# Patient Record
Sex: Female | Born: 1955 | Race: White | Hispanic: No | Marital: Married | Smoking: Never smoker
Health system: Southern US, Community
[De-identification: ages and names within clinical notes are randomized; demographics above are authoritative.]

## PROBLEM LIST (undated history)

## (undated) DIAGNOSIS — I10 Essential (primary) hypertension: Secondary | ICD-10-CM

## (undated) DIAGNOSIS — T7840XA Allergy, unspecified, initial encounter: Secondary | ICD-10-CM

## (undated) HISTORY — DX: Essential (primary) hypertension: I10

## (undated) HISTORY — DX: Allergy, unspecified, initial encounter: T78.40XA

## (undated) HISTORY — PX: OTHER SURGICAL HISTORY: SHX169

---

## 2019-01-24 ENCOUNTER — Other Ambulatory Visit: Payer: Self-pay

## 2019-01-24 ENCOUNTER — Ambulatory Visit (INDEPENDENT_AMBULATORY_CARE_PROVIDER_SITE_OTHER): Payer: Federal, State, Local not specified - PPO | Admitting: Osteopathic Medicine

## 2019-01-24 ENCOUNTER — Encounter: Payer: Self-pay | Admitting: Osteopathic Medicine

## 2019-01-24 VITALS — BP 161/91 | HR 83 | Temp 97.9°F | Ht 62.0 in | Wt 183.0 lb

## 2019-01-24 DIAGNOSIS — Z23 Encounter for immunization: Secondary | ICD-10-CM

## 2019-01-24 DIAGNOSIS — I10 Essential (primary) hypertension: Secondary | ICD-10-CM | POA: Diagnosis not present

## 2019-01-24 DIAGNOSIS — N6320 Unspecified lump in the left breast, unspecified quadrant: Secondary | ICD-10-CM | POA: Insufficient documentation

## 2019-01-24 MED ORDER — HYDROCHLOROTHIAZIDE 25 MG PO TABS: 25.0000 mg | ORAL_TABLET | Freq: Every day | ORAL | 0 refills | Status: DC

## 2019-01-24 MED ORDER — ESOMEPRAZOLE MAGNESIUM 40 MG PO CPDR: 40.0000 mg | DELAYED_RELEASE_CAPSULE | ORAL | 1 refills | Status: DC

## 2019-01-24 NOTE — Progress Notes (Signed)
HPI: Hannah Hull is a 63 y.o. female who  has no past medical history on file.  she presents to [facility] today, 01/24/19,  for chief complaint of: New to establish care  HTN  Doing well overall, she and her husband moved here in April to be closer to family. She is retired. Husband also retired.   HTN: BP "borderline" in the past, younger sister is also on BP meds. No CP/SOB, no HA/VC, no dizziness. No Hx cardiac problems.   Mammogram: needs follow-up on L breast, she had mammo/US in January for mass of some sort, no hx cancer.      Past medical, surgical, social and family history reviewed:  Patient Active Problem List   Diagnosis Date Noted  . Left breast mass 01/24/2019  . Essential hypertension 01/24/2019    Past Surgical History:  Procedure Laterality Date  . broken bone repair      Social History   Tobacco Use  . Smoking status: Never Smoker  . Smokeless tobacco: Never Used  Substance Use Topics  . Alcohol use: Never    Frequency: Never    Family History  Problem Relation Age of Onset  . Breast cancer Maternal Aunt      Current medication list and allergy/intolerance information reviewed:    Current Outpatient Medications  Medication Sig Dispense Refill  . esomeprazole (NEXIUM) 40 MG capsule Take 1 capsule (40 mg total) by mouth every 3 (three) days. 90 capsule 1  . hydrochlorothiazide (HYDRODIURIL) 25 MG tablet Take 1 tablet (25 mg total) by mouth daily. 90 tablet 0   No current facility-administered medications for this visit.     Not on File    Review of Systems:  Constitutional:  No  fever, no chills, No recent illness, No unintentional weight changes. No significant fatigue.   HEENT: No  headache, no vision change, no hearing change, No sore throat, No  sinus pressure  Cardiac: No  chest pain, No  pressure, No palpitations, No  Orthopnea  Respiratory:  No  shortness of breath. No   Cough  Gastrointestinal: No  abdominal pain, No  nausea, No  vomiting,  No  blood in stool, No  diarrhea, No  constipation   Musculoskeletal: No new myalgia/arthralgia  Skin: No  Rash, No other wounds/concerning lesions  Genitourinary: No  incontinence, No  abnormal genital bleeding, No abnormal genital discharge  Hem/Onc: No  easy bruising/bleeding  Endocrine: No cold intolerance,  No heat intolerance. No polyuria/polydipsia/polyphagia   Neurologic: No  weakness, No  dizziness  Psychiatric: No  concerns with depression, No  concerns with anxiety, No sleep problems, No mood problems  Exam:  BP (!) 161/91 (BP Location: Left Arm, Patient Position: Sitting, Cuff Size: Large)   Pulse 83   Temp 97.9 F (36.6 C) (Oral)   Ht 5\' 2"  (1.575 m)   Wt 183 lb (83 kg)   BMI 33.47 kg/m   Constitutional: VS see above. General Appearance: alert, well-developed, well-nourished, NAD  Eyes: Normal lids and conjunctive, non-icteric sclera  Neck: No masses, trachea midline. No thyroid enlargement. No tenderness/mass appreciated. No lymphadenopathy  Respiratory: Normal respiratory effort. no wheeze, no rhonchi, no rales  Cardiovascular: S1/S2 normal, no murmur, no rub/gallop auscultated. RRR. No lower extremity edema.   Gastrointestinal: Nontender, no masses. No hepatomegaly, no splenomegaly. No hernia appreciated. Bowel sounds normal. Rectal exam deferred.   Musculoskeletal: Gait normal. No clubbing/cyanosis of digits.   Neurological: Normal balance/coordination. No tremor.  Skin: warm, dry, intact. No rash/ulcer. No concerning nevi or subq nodules on limited exam.    Psychiatric: Normal judgment/insight. Normal mood and affect. Oriented x3.    No results found for this or any previous visit (from the past 72 hour(s)).  No results found.   ASSESSMENT/PLAN: The primary encounter diagnosis was Essential hypertension. Diagnoses of Need for influenza vaccination and Left breast mass were  also pertinent to this visit.   Orders Placed This Encounter  Procedures  . MM Digital Diagnostic Bilat  . US BREAST COMPLETE UNI LEFT INC AXILLA  . US BREAST COMPLETE UNI RIGHT INC AXILLA  . Flu Vaccine QUAD 6+ mos PF IM (Fluarix Quad PF)    Meds ordered this encounter  Medications  . esomeprazole (NEXIUM) 40 MG capsule    Sig: Take 1 capsule (40 mg total) by mouth every 3 (three) days.    Dispense:  90 capsule    Refill:  1  . hydrochlorothiazide (HYDRODIURIL) 25 MG tablet    Sig: Take 1 tablet (25 mg total) by mouth daily.    Dispense:  90 tablet    Refill:  0        Visit summary with medication list and pertinent instructions was printed for patient to review. All questions at time of visit were answered - patient instructed to contact office with any additional concerns or updates. ER/RTC precautions were reviewed with the patient.      Please note: voice recognition software was used to produce this document, and typos may escape review. Please contact Dr. Lyn Hollingshead for any needed clarifications.     Follow-up plan: Return in about 1 week (around 01/31/2019) for nurse visit BP recheck - further follow-up based on BP numbers. Will need labs probably 4-6 weeks. Marland Kitchen

## 2019-01-31 ENCOUNTER — Other Ambulatory Visit: Payer: Self-pay

## 2019-01-31 ENCOUNTER — Ambulatory Visit (INDEPENDENT_AMBULATORY_CARE_PROVIDER_SITE_OTHER): Payer: Federal, State, Local not specified - PPO | Admitting: Osteopathic Medicine

## 2019-01-31 VITALS — BP 137/64 | HR 80 | Wt 181.0 lb

## 2019-01-31 DIAGNOSIS — I10 Essential (primary) hypertension: Secondary | ICD-10-CM | POA: Diagnosis not present

## 2019-01-31 NOTE — Progress Notes (Signed)
Established Patient Office Visit  Subjective:  Patient ID: Hannah Hull, female    DOB: 1955-05-14  Age: 63 y.o. MRN: [id number]  CC:  Chief Complaint  Patient presents with  . Hypertension    HPI Hannah Hull presents for blood pressure check. Denies chest pain, shortness of breath or dizziness. She reports no medication problems. She did bring in her home blood pressure cuff. Readings from our office and the home monitor were about the same.   Home readings 137/82 134/82 143/78 130/88 138/83  History reviewed. No pertinent past medical history.  Past Surgical History:  Procedure Laterality Date  . broken bone repair      Family History  Problem Relation Age of Onset  . Breast cancer Maternal Aunt     Social History   Socioeconomic History  . Marital status: Married    Spouse name: Not on file  . Number of children: Not on file  . Years of education: Not on file  . Highest education level: Not on file  Occupational History  . Not on file  Social Needs  . Financial resource strain: Not on file  . Food insecurity    Worry: Not on file    Inability: Not on file  . Transportation needs    Medical: Not on file    Non-medical: Not on file  Tobacco Use  . Smoking status: Never Smoker  . Smokeless tobacco: Never Used  Substance and Sexual Activity  . Alcohol use: Never    Frequency: Never  . Drug use: Never  . Sexual activity: Not Currently    Partners: Male    Birth control/protection: Post-menopausal  Lifestyle  . Physical activity    Days per week: Not on file    Minutes per session: Not on file  . Stress: Not on file  Relationships  . Social Herbalist on phone: Not on file    Gets together: Not on file    Attends religious service: Not on file    Active member of club or organization: Not on file    Attends meetings of clubs or organizations: Not on file    Relationship status: Not on file  . Intimate partner violence    Fear  of current or ex partner: Not on file    Emotionally abused: Not on file    Physically abused: Not on file    Forced sexual activity: Not on file  Other Topics Concern  . Not on file  Social History Narrative  . Not on file    Outpatient Medications Prior to Visit  Medication Sig Dispense Refill  . esomeprazole (NEXIUM) 40 MG capsule Take 1 capsule (40 mg total) by mouth every 3 (three) days. 90 capsule 1  . hydrochlorothiazide (HYDRODIURIL) 25 MG tablet Take 1 tablet (25 mg total) by mouth daily. 90 tablet 0   No facility-administered medications prior to visit.     Not on File  ROS Review of Systems    Objective:    Physical Exam  BP 137/64   Pulse 80   Wt 181 lb (82.1 kg)   SpO2 (!) 80%   BMI 33.11 kg/m  Wt Readings from Last 3 Encounters:  01/31/19 181 lb (82.1 kg)  01/24/19 183 lb (83 kg)     Health Maintenance Due  Topic Date Due  . Hepatitis C Screening  Jul 11, 1955  . HIV Screening  02/21/1971  . PAP SMEAR-Modifier  02/20/1977  . MAMMOGRAM  02/20/2006  .  COLONOSCOPY  02/20/2006    There are no preventive care reminders to display for this patient.  No results found for: TSH No results found for: WBC, HGB, HCT, MCV, PLT No results found for: NA, K, CHLORIDE, CO2, GLUCOSE, BUN, CREATININE, BILITOT, ALKPHOS, AST, ALT, PROT, ALBUMIN, CALCIUM, ANIONGAP, EGFR, GFR No results found for: CHOL No results found for: HDL No results found for: LDLCALC No results found for: TRIG No results found for: CHOLHDL No results found for: HGBA1C    Assessment & Plan:  HTN - Patient advised to continue current medications as directed. Follow up in 3 months with Dr Sheppard Coil for HTN. Advised to call if readings are over 140/90 or if she has any medication problems.     Problem List Items Addressed This Visit    Essential hypertension - Primary      No orders of the defined types were placed in this encounter.   Follow-up: Return in about 3 months (around  05/03/2019) for with Dr Sheppard Coil for HTN. Durene Romans, Monico Blitz, [place]

## 2019-01-31 NOTE — Patient Instructions (Signed)
Continue current medications as directed. Follow up in 3 months with Dr Sheppard Coil for HTN.

## 2019-02-07 ENCOUNTER — Ambulatory Visit: Payer: Federal, State, Local not specified - PPO

## 2019-02-07 ENCOUNTER — Ambulatory Visit
Admission: RE | Admit: 2019-02-07 | Discharge: 2019-02-07 | Disposition: A | Discharge status: Home / Self Care | Payer: Federal, State, Local not specified - PPO | Source: Ambulatory Visit | Attending: Osteopathic Medicine | Admitting: Osteopathic Medicine

## 2019-02-07 ENCOUNTER — Other Ambulatory Visit: Payer: Self-pay | Admitting: Osteopathic Medicine

## 2019-02-07 ENCOUNTER — Other Ambulatory Visit: Payer: Self-pay

## 2019-02-07 DIAGNOSIS — N6320 Unspecified lump in the left breast, unspecified quadrant: Secondary | ICD-10-CM

## 2019-04-16 ENCOUNTER — Other Ambulatory Visit: Payer: Self-pay | Admitting: Osteopathic Medicine

## 2019-05-03 ENCOUNTER — Encounter: Payer: Self-pay | Admitting: Osteopathic Medicine

## 2019-05-03 ENCOUNTER — Ambulatory Visit (INDEPENDENT_AMBULATORY_CARE_PROVIDER_SITE_OTHER): Payer: Federal, State, Local not specified - PPO | Admitting: Osteopathic Medicine

## 2019-05-03 ENCOUNTER — Other Ambulatory Visit: Payer: Self-pay

## 2019-05-03 VITALS — BP 120/83 | HR 89 | Temp 98.7°F | Wt 189.0 lb

## 2019-05-03 DIAGNOSIS — Z0000 Encounter for general adult medical examination without abnormal findings: Secondary | ICD-10-CM

## 2019-05-03 DIAGNOSIS — I10 Essential (primary) hypertension: Secondary | ICD-10-CM | POA: Diagnosis not present

## 2019-05-03 DIAGNOSIS — Z1211 Encounter for screening for malignant neoplasm of colon: Secondary | ICD-10-CM

## 2019-05-03 DIAGNOSIS — K635 Polyp of colon: Secondary | ICD-10-CM

## 2019-05-03 NOTE — Patient Instructions (Addendum)
General Preventive Care  Most recent routine screening lipids/other labs: ordered today   Tobacco: don't!   Alcohol: responsible moderation is ok for most adults - if you have concerns about your alcohol intake, please talk to me!   Exercise: as tolerated to reduce risk of cardiovascular disease and diabetes. Strength training will also prevent osteoporosis.   Mental health: if need for mental health care (medicines, counseling, other), or concerns about moods, please let me know!   Sexual health: if need for STD testing, or if concerns with libido/pain problems, please let me know!  Advanced Directive: Living Will and/or Healthcare Power of Attorney recommended for all adults, regardless of age or health.  Vaccines  Flu vaccine: recommended for almost everyone, every fall.   Shingles vaccine: Shingrix recommended after age 22  Pneumonia vaccines: Prevnar and Pneumovax recommended after age 76, or sooner if certain medical conditions.  Tetanus booster: Tdap recommended every 10 years.  Cancer screenings   Colon cancer screening: referred to GI  Breast cancer screening: mammogram recommended annually after age 77.   Cervical cancer screening: Pap every 1 to 5 years depending on age and other risk factors. Can usually stop at age 93 or w/ hysterectomy.   Lung cancer screening: not needed for non-smokers  Infection screenings . HIV: recommended screening at least once age 69-65, more often as needed.  . Gonorrhea/Chlamydia: screening as needed . Hepatitis C: recommended for anyone born [birth year]-1965 . TB: certain at-risk populations, or depending on work requirements and/or travel history Other . Bone Density Test: recommended for women at age 62

## 2019-05-03 NOTE — Progress Notes (Signed)
HPI: Hannah Hull is a 64 y.o. female who  has no past medical history on file.  she presents to [facility] today, 05/03/19,  for chief complaint of: Physical BP recheck     Past medical, surgical, social and family history reviewed:  Patient Active Problem List   Diagnosis Date Noted  . Left breast mass 01/24/2019  . Essential hypertension 01/24/2019    Past Surgical History:  Procedure Laterality Date  . broken bone repair      Social History   Tobacco Use  . Smoking status: Never Smoker  . Smokeless tobacco: Never Used  Substance Use Topics  . Alcohol use: Never    Family History  Problem Relation Age of Onset  . Breast cancer Maternal Aunt      Current medication list and allergy/intolerance information reviewed:    Current Outpatient Medications  Medication Sig Dispense Refill  . esomeprazole (NEXIUM) 40 MG capsule Take 1 capsule (40 mg total) by mouth every 3 (three) days. 90 capsule 1  . hydrochlorothiazide (HYDRODIURIL) 25 MG tablet TAKE 1 TABLET BY MOUTH EVERY DAY 90 tablet 0   No current facility-administered medications for this visit.    Allergies  Allergen Reactions  . Other Anaphylaxis, Hives and Swelling    Tree nut allergy      Review of Systems:  Constitutional:  No  fever, no chills, No recent illness, No unintentional weight changes. No significant fatigue.   HEENT: No  headache, no vision change  Cardiac: No  chest pain, No  pressure, No palpitations, No  Orthopnea  Respiratory:  No  shortness of breath. No  Cough  Gastrointestinal: No  abdominal pain, No  nausea  Hem/Onc: No  easy bruising/bleeding, No  abnormal lymph node  Endocrine: No cold intolerance,  No heat intolerance.  Neurologic: No  weakness, No  dizziness,  Psychiatric: No  concerns with depression, No  concerns with anxiety,   Exam:  BP 120/83 (BP Location: Left Arm, Patient Position: Sitting, Cuff Size: Large)   Pulse  89   Temp 98.7 F (37.1 C) (Oral)   Wt 189 lb 0.6 oz (85.7 kg)   BMI 34.58 kg/m   Constitutional: VS see above. General Appearance: alert, well-developed, well-nourished, NAD  Eyes: Normal lids and conjunctive, non-icteric sclera  Ears, Nose, Mouth, Throat: MMM, Normal external inspection ears/nares/mouth/lips/gums. TM normal bilaterally. Pharynx/tonsils no erythema, no exudate. Nasal mucosa normal.   Neck: No masses, trachea midline. No thyroid enlargement. No tenderness/mass appreciated. No lymphadenopathy  Respiratory: Normal respiratory effort. no wheeze, no rhonchi, no rales  Cardiovascular: S1/S2 normal, no murmur, no rub/gallop auscultated. RRR. No lower extremity edema. Pedal pulse II/IV bilaterally DP and PT. No carotid bruit or JVD. No abdominal aortic bruit.  Gastrointestinal: Nontender, no masses. No hepatomegaly, no splenomegaly. No hernia appreciated. Bowel sounds normal. Rectal exam deferred.   Musculoskeletal: Gait normal. No clubbing/cyanosis of digits.   Neurological: Normal balance/coordination. No tremor. No cranial nerve deficit on limited exam. Motor and sensation intact and symmetric. Cerebellar reflexes intact.   Skin: warm, dry, intact. No rash/ulcer. No concerning nevi or subq nodules on limited exam.    Psychiatric: Normal judgment/insight. Normal mood and affect. Oriented x3.    No results found for this or any previous visit (from the past 72 hour(s)).  No results found.   ASSESSMENT/PLAN: The primary encounter diagnosis was Annual physical exam. Diagnoses of Essential hypertension, Polyp of colon, unspecified part of colon, unspecified type, and Colon  cancer screening were also pertinent to this visit.   Orders Placed This Encounter  Procedures  . COMPLETE METABOLIC PANEL WITH GFR  . Lipid panel  . CBC  . Ambulatory referral to Gastroenterology    No orders of the defined types were placed in this encounter.   Patient Instructions   General Preventive Care  Most recent routine screening lipids/other labs: ordered today   Tobacco: don't!   Alcohol: responsible moderation is ok for most adults - if you have concerns about your alcohol intake, please talk to me!   Exercise: as tolerated to reduce risk of cardiovascular disease and diabetes. Strength training will also prevent osteoporosis.   Mental health: if need for mental health care (medicines, counseling, other), or concerns about moods, please let me know!   Sexual health: if need for STD testing, or if concerns with libido/pain problems, please let me know!  Advanced Directive: Living Will and/or Healthcare Power of Attorney recommended for all adults, regardless of age or health.  Vaccines  Flu vaccine: recommended for almost everyone, every fall.   Shingles vaccine: Shingrix recommended after age 45  Pneumonia vaccines: Prevnar and Pneumovax recommended after age 65, or sooner if certain medical conditions.  Tetanus booster: Tdap recommended every 10 years.  Cancer screenings   Colon cancer screening: referred to GI  Breast cancer screening: mammogram recommended annually after age 18.   Cervical cancer screening: Pap every 1 to 5 years depending on age and other risk factors. Can usually stop at age 103 or w/ hysterectomy.   Lung cancer screening: not needed for non-smokers  Infection screenings . HIV: recommended screening at least once age 76-65, more often as needed.  . Gonorrhea/Chlamydia: screening as needed . Hepatitis C: recommended for anyone born [birth year]-1965 . TB: certain at-risk populations, or depending on work requirements and/or travel history Other . Bone Density Test: recommended for women at age 25            Visit summary with medication list and pertinent instructions was printed for patient to review. All questions at time of visit were answered - patient instructed to contact office with any additional concerns or  updates. ER/RTC precautions were reviewed with the patient.   are was used to produce this document, and typos may escape review. Please contact Dr. Sheppard Coil for any needed clarifications.     Follow-up plan: Return for lab visit only, fasting, about 6 weeks or so. If that's good, see me in 6 mos follow up BP! Marland Kitchen

## 2019-06-12 ENCOUNTER — Encounter: Payer: Self-pay | Admitting: Medical-Surgical

## 2019-06-20 LAB — CBC
HCT: 38.3 % (ref 35.0–45.0)
Hemoglobin: 12.4 g/dL (ref 11.7–15.5)
MCH: 26.6 pg — ABNORMAL LOW (ref 27.0–33.0)
MCHC: 32.4 g/dL (ref 32.0–36.0)
MCV: 82.2 fL (ref 80.0–100.0)
MPV: 11.8 fL (ref 7.5–12.5)
Platelets: 262 10*3/uL (ref 140–400)
RBC: 4.66 10*6/uL (ref 3.80–5.10)
RDW: 13.1 % (ref 11.0–15.0)
WBC: 7.5 10*3/uL (ref 3.8–10.8)

## 2019-06-20 LAB — COMPLETE METABOLIC PANEL WITH GFR
AG Ratio: 1.4 (calc) (ref 1.0–2.5)
ALT: 17 U/L (ref 6–29)
AST: 18 U/L (ref 10–35)
Albumin: 4.1 g/dL (ref 3.6–5.1)
Alkaline phosphatase (APISO): 81 U/L (ref 37–153)
BUN: 19 mg/dL (ref 7–25)
CO2: 28 mmol/L (ref 20–32)
Calcium: 9.4 mg/dL (ref 8.6–10.4)
Chloride: 102 mmol/L (ref 98–110)
Creat: 0.8 mg/dL (ref 0.50–0.99)
GFR, Est African American: 91 mL/min/{1.73_m2} (ref >=60)
GFR, Est Non African American: 78 mL/min/{1.73_m2} (ref >=60)
Globulin: 2.9 g/dL (calc) (ref 1.9–3.7)
Glucose, Bld: 92 mg/dL (ref 65–99)
Potassium: 4.1 mmol/L (ref 3.5–5.3)
Sodium: 139 mmol/L (ref 135–146)
Total Bilirubin: 0.5 mg/dL (ref 0.2–1.2)
Total Protein: 7 g/dL (ref 6.1–8.1)

## 2019-06-20 LAB — LIPID PANEL
Cholesterol: 191 mg/dL (ref <200)
HDL: 73 mg/dL (ref >=50)
LDL Cholesterol (Calc): 99 mg/dL (calc)
Non-HDL Cholesterol (Calc): 118 mg/dL (calc) (ref <130)
Total CHOL/HDL Ratio: 2.6 (calc) (ref <5.0)
Triglycerides: 99 mg/dL (ref <150)

## 2019-07-10 ENCOUNTER — Other Ambulatory Visit: Payer: Self-pay | Admitting: Osteopathic Medicine

## 2019-08-29 ENCOUNTER — Encounter: Payer: Self-pay | Admitting: Osteopathic Medicine

## 2019-09-01 ENCOUNTER — Other Ambulatory Visit: Payer: Self-pay | Admitting: Osteopathic Medicine

## 2019-09-01 DIAGNOSIS — N6489 Other specified disorders of breast: Secondary | ICD-10-CM

## 2019-09-22 ENCOUNTER — Other Ambulatory Visit: Payer: Self-pay

## 2019-09-22 ENCOUNTER — Ambulatory Visit
Admission: RE | Admit: 2019-09-22 | Discharge: 2019-09-22 | Disposition: A | Discharge status: Home / Self Care | Payer: Federal, State, Local not specified - PPO | Source: Ambulatory Visit | Attending: Osteopathic Medicine | Admitting: Osteopathic Medicine

## 2019-09-22 DIAGNOSIS — N6489 Other specified disorders of breast: Secondary | ICD-10-CM

## 2019-10-24 ENCOUNTER — Telehealth: Payer: Self-pay

## 2019-10-24 NOTE — Telephone Encounter (Signed)
Hannah Hull states she is now taking the Nexium every two days and would like the prescription changed. Please advise.

## 2019-10-26 MED ORDER — ESOMEPRAZOLE MAGNESIUM 40 MG PO CPDR: 40.0000 mg | DELAYED_RELEASE_CAPSULE | ORAL | 1 refills | Status: DC

## 2019-11-22 ENCOUNTER — Ambulatory Visit (INDEPENDENT_AMBULATORY_CARE_PROVIDER_SITE_OTHER): Payer: Federal, State, Local not specified - PPO | Admitting: Osteopathic Medicine

## 2019-11-22 ENCOUNTER — Encounter: Payer: Self-pay | Admitting: Osteopathic Medicine

## 2019-11-22 VITALS — BP 129/79 | HR 75 | Wt 187.0 lb

## 2019-11-22 DIAGNOSIS — I10 Essential (primary) hypertension: Secondary | ICD-10-CM | POA: Diagnosis not present

## 2019-11-22 MED ORDER — HYDROCHLOROTHIAZIDE 25 MG PO TABS: 25.0000 mg | ORAL_TABLET | Freq: Every day | ORAL | 3 refills | Status: DC

## 2019-11-22 NOTE — Progress Notes (Signed)
Hannah Hull is a 64 y.o. female who presents to  [facility]  today, 11/22/19, seeking care for the following:  . BP check  BP Readings from Last 3 Encounters:  11/22/19 129/79  05/03/19 120/83  01/31/19 137/64      ASSESSMENT & PLAN with other pertinent findings:  The encounter diagnosis was Essential hypertension.   No results found for this or any previous visit (from the past 24 hour(s)).  There are no Patient Instructions on file for this visit.  No orders of the defined types were placed in this encounter.   Meds ordered this encounter  Medications  . hydrochlorothiazide (HYDRODIURIL) 25 MG tablet    Sig: Take 1 tablet (25 mg total) by mouth daily.    Dispense:  90 tablet    Refill:  3       Follow-up instructions: Return in about 6 months (around 05/24/2020) for ANNUAL (call week prior to visit for lab orders).                                         BP 129/79 (BP Location: Left Arm, Patient Position: Sitting)   Pulse 75   Wt 187 lb (84.8 kg)   SpO2 96%   BMI 34.20 kg/m   Current Meds  Medication Sig  . esomeprazole (NEXIUM) 40 MG capsule Take 1 capsule (40 mg total) by mouth every other day.  . hydrochlorothiazide (HYDRODIURIL) 25 MG tablet Take 1 tablet (25 mg total) by mouth daily.  . [DISCONTINUED] hydrochlorothiazide (HYDRODIURIL) 25 MG tablet TAKE 1 TABLET BY MOUTH EVERY DAY    No results found for this or any previous visit (from the past 72 hour(s)).  No results found.     All questions at time of visit were answered - patient instructed to contact office with any additional concerns or updates.  ER/RTC precautions were reviewed with the patient as applicable.   Please note: voice recognition software was used to produce this document, and typos may escape review. Please contact Dr. Lyn Hollingshead for any needed clarifications.

## 2020-05-29 ENCOUNTER — Ambulatory Visit (INDEPENDENT_AMBULATORY_CARE_PROVIDER_SITE_OTHER): Payer: Federal, State, Local not specified - PPO | Admitting: Osteopathic Medicine

## 2020-05-29 ENCOUNTER — Encounter: Payer: Self-pay | Admitting: Osteopathic Medicine

## 2020-05-29 ENCOUNTER — Other Ambulatory Visit: Payer: Self-pay

## 2020-05-29 VITALS — BP 135/83 | HR 73 | Temp 98.1°F | Wt 185.1 lb

## 2020-05-29 DIAGNOSIS — I10 Essential (primary) hypertension: Secondary | ICD-10-CM | POA: Diagnosis not present

## 2020-05-29 DIAGNOSIS — Z0000 Encounter for general adult medical examination without abnormal findings: Secondary | ICD-10-CM | POA: Diagnosis not present

## 2020-05-29 DIAGNOSIS — Z1231 Encounter for screening mammogram for malignant neoplasm of breast: Secondary | ICD-10-CM

## 2020-05-29 DIAGNOSIS — K635 Polyp of colon: Secondary | ICD-10-CM

## 2020-05-29 DIAGNOSIS — Z1211 Encounter for screening for malignant neoplasm of colon: Secondary | ICD-10-CM | POA: Diagnosis not present

## 2020-05-29 MED ORDER — ESOMEPRAZOLE MAGNESIUM 40 MG PO CPDR: 40.0000 mg | DELAYED_RELEASE_CAPSULE | ORAL | 1 refills | Status: DC

## 2020-05-29 MED ORDER — HYDROCHLOROTHIAZIDE 25 MG PO TABS: 25.0000 mg | ORAL_TABLET | Freq: Every day | ORAL | 3 refills | Status: DC

## 2020-05-29 NOTE — Patient Instructions (Addendum)
General Preventive Care  Most recent routine screening labs: ordered.   Blood pressure goal 130/80 or less.   Tobacco: don't!   Alcohol: responsible moderation is ok for most adults - if you have concerns about your alcohol intake, please talk to me!   Exercise: as tolerated to reduce risk of cardiovascular disease and diabetes. Strength training will also prevent osteoporosis.   Mental health: if need for mental health care (medicines, counseling, other), or concerns about moods, please let me know!   Sexual / Reproductive health: if need for STD testing, or if concerns with libido/pain problems, please let me know!  Advanced Directive: Living Will and/or Healthcare Power of Attorney recommended for all adults, regardless of age or health.  Vaccines   Flu vaccine: every fall/winter  Shingles vaccine: at age 70.   Pneumonia vaccines: at age 76  Tetanus booster: every 10 years   COVID vaccine: THANKS for getting your vaccine! :) Cancer screenings   Colon cancer screening: colonoscopy when due (we never got records!)  Breast cancer screening: mammogram due 08/2020 - ordered!   Cervical cancer screening:   Lung cancer screening: not needed for non-smokers  Infection screenings  . HIV: recommended screening at least once age 45-65, more often as needed. . Gonorrhea/Chlamydia: screening as needed. . Hepatitis C: recommended once for everyone age 42-75 . TB: certain at-risk populations, or depending on work requirements and/or travel history Other . Bone Density Test: recommended at age 24

## 2020-05-29 NOTE — Progress Notes (Signed)
Hannah Hull is a 65 y.o. female who presents to  [facility] at [facility]  today, 05/29/20, seeking care for the following:  . Annual physical      ASSESSMENT & PLAN with other pertinent findings:  The primary encounter diagnosis was Annual physical exam. Diagnoses of Essential hypertension, Polyp of colon, unspecified part of colon, unspecified type, Colon cancer screening, and Breast cancer screening by mammogram were also pertinent to this visit.   Needs records: colonoscopy, pap, vaccines Pt declines all vaccinations  Pt declines GI referral  Plan for Pap next year  Ordered mammo for 08/2020   Patient Instructions  General Preventive Care  Most recent routine screening labs: ordered.   Blood pressure goal 130/80 or less.   Tobacco: don't!   Alcohol: responsible moderation is ok for most adults - if you have concerns about your alcohol intake, please talk to me!   Exercise: as tolerated to reduce risk of cardiovascular disease and diabetes. Strength training will also prevent osteoporosis.   Mental health: if need for mental health care (medicines, counseling, other), or concerns about moods, please let me know!   Sexual / Reproductive health: if need for STD testing, or if concerns with libido/pain problems, please let me know!  Advanced Directive: Living Will and/or Healthcare Power of Attorney recommended for all adults, regardless of age or health.  Vaccines   Flu vaccine: every fall/winter  Shingles vaccine: at age 33.   Pneumonia vaccines: at age 34  Tetanus booster: every 10 years   COVID vaccine: THANKS for getting your vaccine! :) Cancer screenings   Colon cancer screening: colonoscopy when due (we never got records!)  Breast cancer screening: mammogram due 08/2020 - ordered!   Cervical cancer screening:   Lung cancer screening: not needed for non-smokers  Infection screenings  . HIV: recommended  screening at least once age 26-65, more often as needed. . Gonorrhea/Chlamydia: screening as needed. . Hepatitis C: recommended once for everyone age 51-75 . TB: certain at-risk populations, or depending on work requirements and/or travel history Other . Bone Density Test: recommended at age 68   Orders Placed This Encounter  Procedures  . MM 3D SCREEN BREAST BILATERAL  . CBC  . COMPLETE METABOLIC PANEL WITH GFR  . Lipid panel    Meds ordered this encounter  Medications  . esomeprazole (NEXIUM) 40 MG capsule    Sig: Take 1 capsule (40 mg total) by mouth every other day.    Dispense:  90 capsule    Refill:  1  . hydrochlorothiazide (HYDRODIURIL) 25 MG tablet    Sig: Take 1 tablet (25 mg total) by mouth daily.    Dispense:  90 tablet    Refill:  3             See below for relevant physical exam findings  See below for recent lab and imaging results reviewed  Medications, allergies, PMH, PSH, SocH, FamH reviewed below    Follow-up instructions: Return in about 1 year (around 05/29/2021) for [employer] - SEE [place] SOONER IF NEEDED.                                        Exam:  BP 135/83 (BP Location: Left Arm, Patient Position: Sitting, Cuff Size: Large)   Pulse 73   Temp 98.1 F (36.7 C) (Oral)   Wt  185 lb 1.3 oz (84 kg)   BMI 33.85 kg/m   Constitutional: VS see above. General Appearance: alert, well-developed, well-nourished, NAD  Neck: No masses, trachea midline.   Respiratory: Normal respiratory effort. no wheeze, no rhonchi, no rales  Cardiovascular: S1/S2 normal, no murmur, no rub/gallop auscultated. RRR.   Musculoskeletal: Gait normal. Symmetric and independent movement of all extremities  Abdominal: non-tender, non-distended, no appreciable organomegaly, neg Murphy's, BS WNLx4  Neurological: Normal balance/coordination. No tremor.  Skin: warm, dry, intact.   Psychiatric: Normal judgment/insight. Normal  mood and affect. Oriented x3.   Current Meds  Medication Sig  . [DISCONTINUED] esomeprazole (NEXIUM) 40 MG capsule Take 1 capsule (40 mg total) by mouth every other day.  . [DISCONTINUED] hydrochlorothiazide (HYDRODIURIL) 25 MG tablet Take 1 tablet (25 mg total) by mouth daily.    Allergies  Allergen Reactions  . Other Anaphylaxis, Hives and Swelling    Tree nut allergy    Patient Active Problem List   Diagnosis Date Noted  . Left breast mass 01/24/2019  . Essential hypertension 01/24/2019    Family History  Problem Relation Age of Onset  . Breast cancer Maternal Aunt     Social History   Tobacco Use  Smoking Status Never Smoker  Smokeless Tobacco Never Used    Past Surgical History:  Procedure Laterality Date  . broken bone repair      Immunization History  Administered Date(s) Administered  . Influenza,inj,Quad PF,6+ Mos 01/24/2019  . Moderna Sars-Covid-2 Vaccination 06/26/2019, 07/23/2019    No results found for this or any previous visit (from the past 2160 hour(s)).  No results found.     All questions at time of visit were answered - patient instructed to contact office with any additional concerns or updates. ER/RTC precautions were reviewed with the patient as applicable.   Please note: manual typing as well as voice recognition software may have been used to produce this document - typos may escape review. Please contact Dr. Lyn Hollingshead for any needed clarifications.

## 2020-06-18 LAB — CBC
HCT: 40.7 % (ref 35.0–45.0)
Hemoglobin: 13.3 g/dL (ref 11.7–15.5)
MCH: 26.3 pg — ABNORMAL LOW (ref 27.0–33.0)
MCHC: 32.7 g/dL (ref 32.0–36.0)
MCV: 80.4 fL (ref 80.0–100.0)
MPV: 11.4 fL (ref 7.5–12.5)
Platelets: 284 10*3/uL (ref 140–400)
RBC: 5.06 10*6/uL (ref 3.80–5.10)
RDW: 12.6 % (ref 11.0–15.0)
WBC: 7.5 10*3/uL (ref 3.8–10.8)

## 2020-06-18 LAB — COMPLETE METABOLIC PANEL WITH GFR
AG Ratio: 1.3 (calc) (ref 1.0–2.5)
ALT: 13 U/L (ref 6–29)
AST: 16 U/L (ref 10–35)
Albumin: 4.1 g/dL (ref 3.6–5.1)
Alkaline phosphatase (APISO): 91 U/L (ref 37–153)
BUN: 17 mg/dL (ref 7–25)
CO2: 29 mmol/L (ref 20–32)
Calcium: 9.3 mg/dL (ref 8.6–10.4)
Chloride: 99 mmol/L (ref 98–110)
Creat: 0.86 mg/dL (ref 0.50–0.99)
GFR, Est African American: 83 mL/min/{1.73_m2} (ref >=60)
GFR, Est Non African American: 71 mL/min/{1.73_m2} (ref >=60)
Globulin: 3.2 g/dL (calc) (ref 1.9–3.7)
Glucose, Bld: 88 mg/dL (ref 65–99)
Potassium: 3.8 mmol/L (ref 3.5–5.3)
Sodium: 138 mmol/L (ref 135–146)
Total Bilirubin: 0.8 mg/dL (ref 0.2–1.2)
Total Protein: 7.3 g/dL (ref 6.1–8.1)

## 2020-06-18 LAB — LIPID PANEL
Cholesterol: 208 mg/dL — ABNORMAL HIGH (ref <200)
HDL: 75 mg/dL (ref >=50)
LDL Cholesterol (Calc): 113 mg/dL (calc) — ABNORMAL HIGH
Non-HDL Cholesterol (Calc): 133 mg/dL (calc) — ABNORMAL HIGH (ref <130)
Total CHOL/HDL Ratio: 2.8 (calc) (ref <5.0)
Triglycerides: 98 mg/dL (ref <150)

## 2020-07-18 IMAGING — MG DIGITAL DIAGNOSTIC BILAT W/ TOMO W/ CAD
6 of 12 series · 6 of 36 positions shown · non-contrast
Comparison: Previous exam(s).

CLINICAL DATA: Short-term follow-up for probably benign asymmetry
in the inner posterior left breast.

EXAM:
DIGITAL DIAGNOSTIC BILATERAL MAMMOGRAM WITH CAD AND TOMO

[R CC synth-2D (1 of 2)]
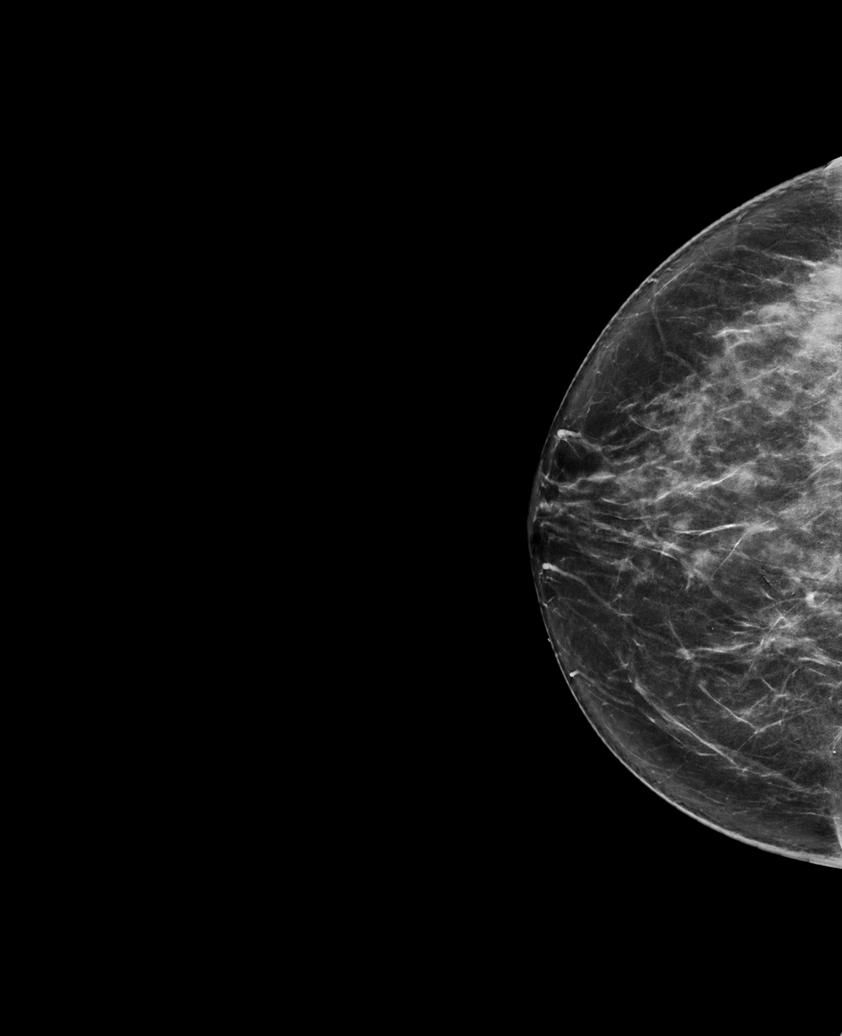

[L MLO synth-2D]
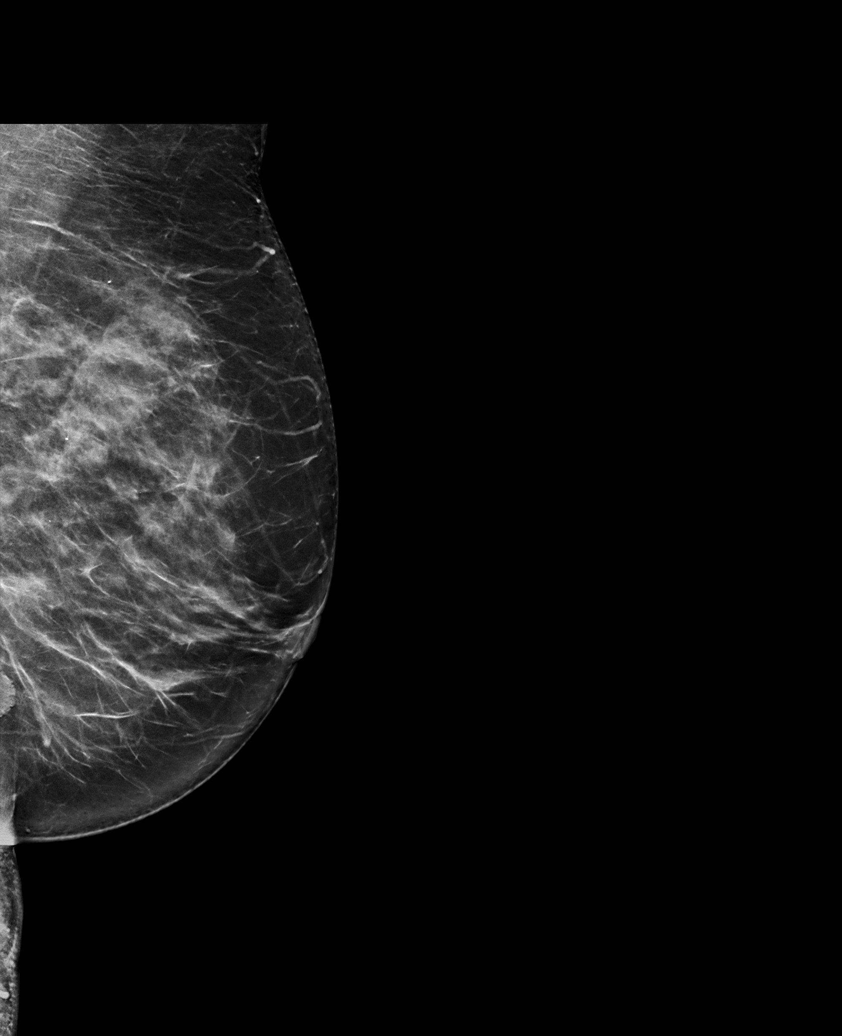

[R CC synth-2D (2 of 2)]
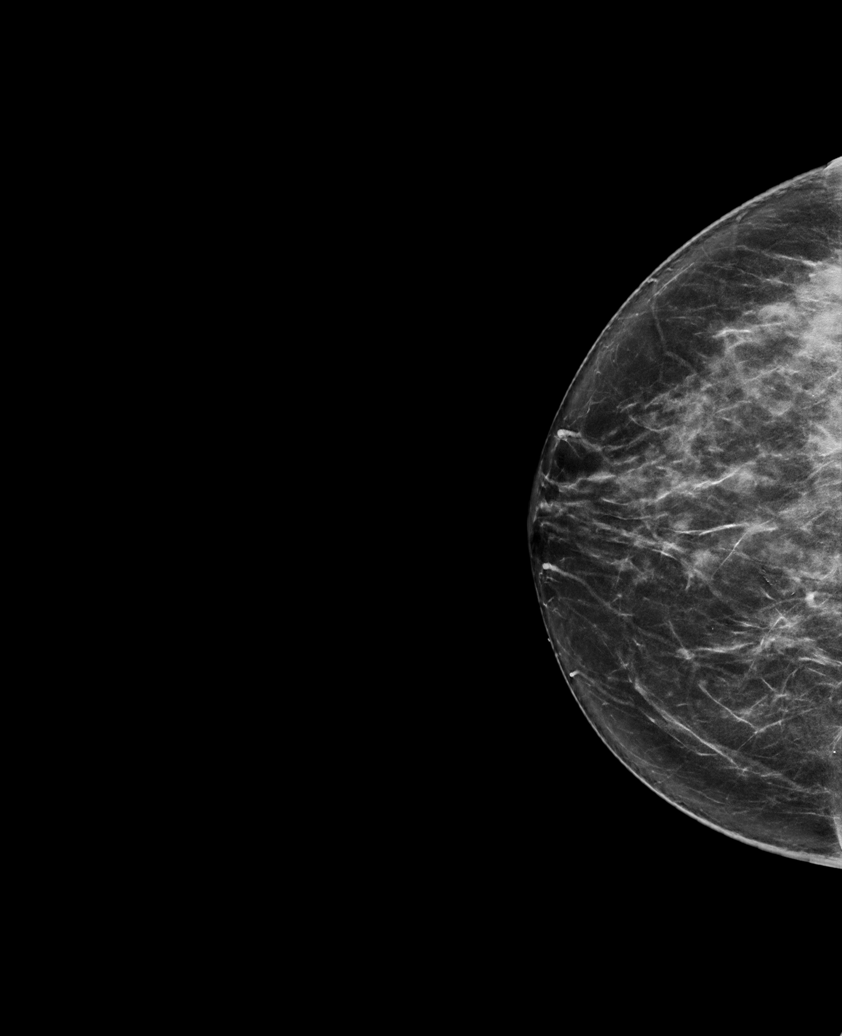

[L CC synth-2D]
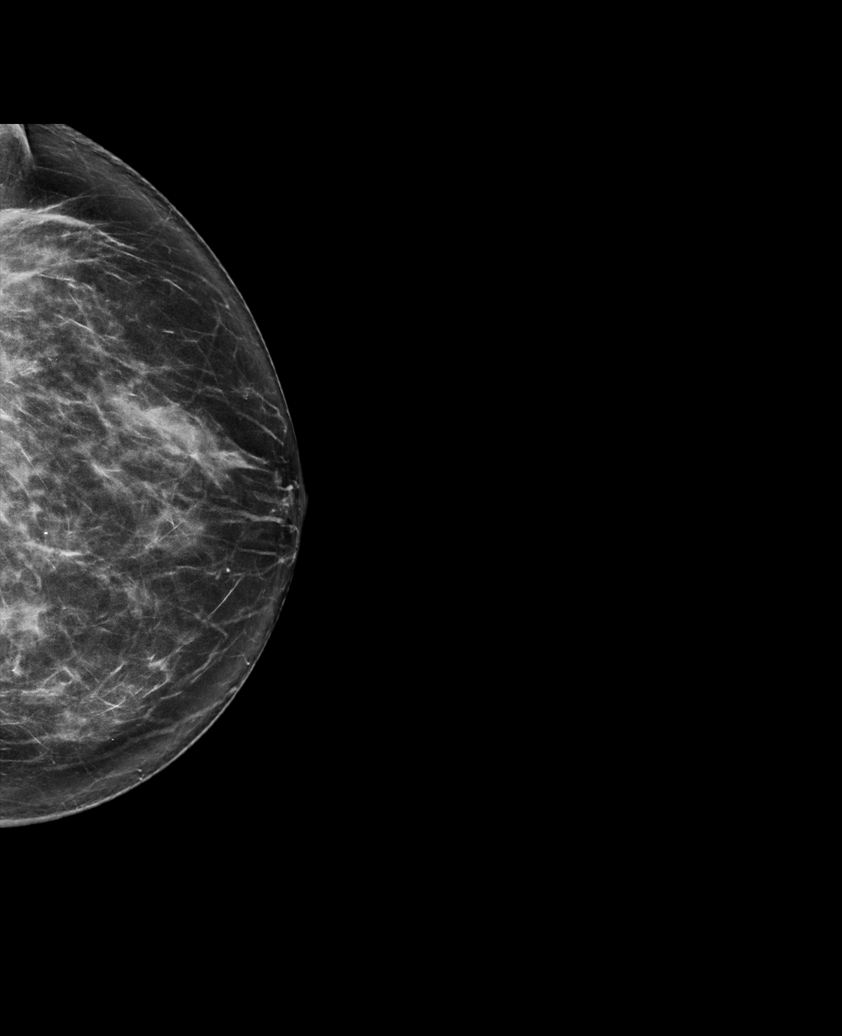

[R MLO synth-2D (1 of 2)]
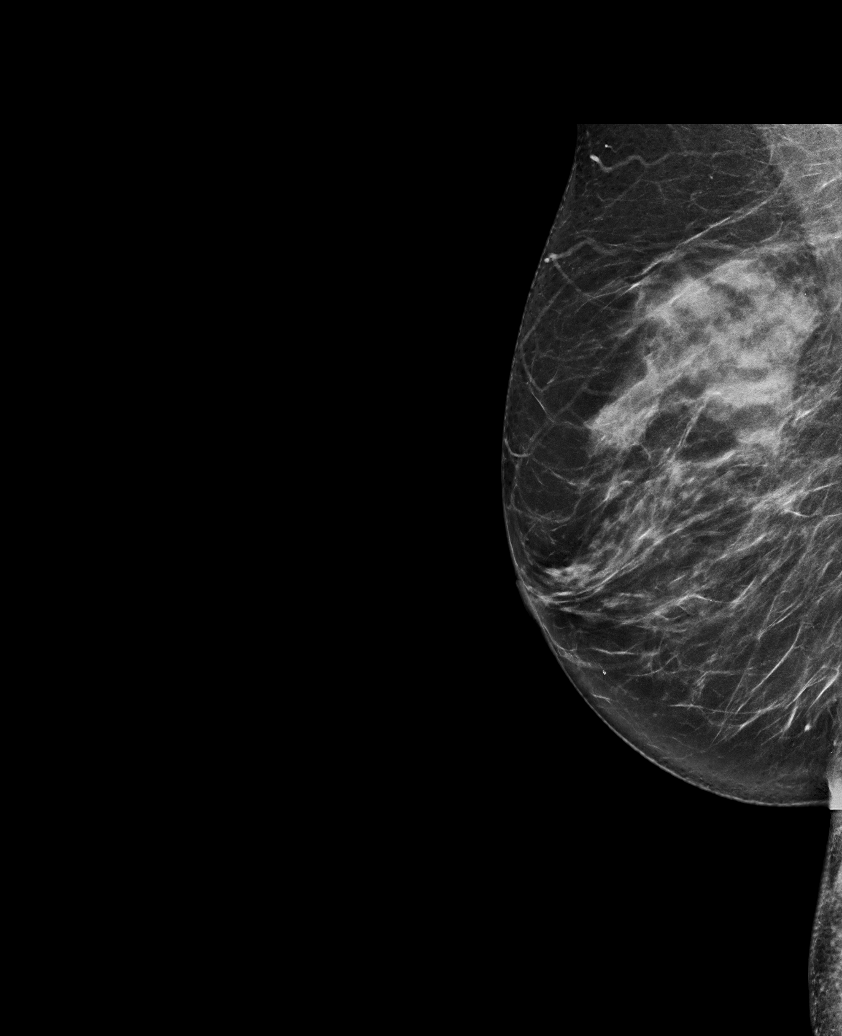

[R MLO synth-2D (2 of 2)]
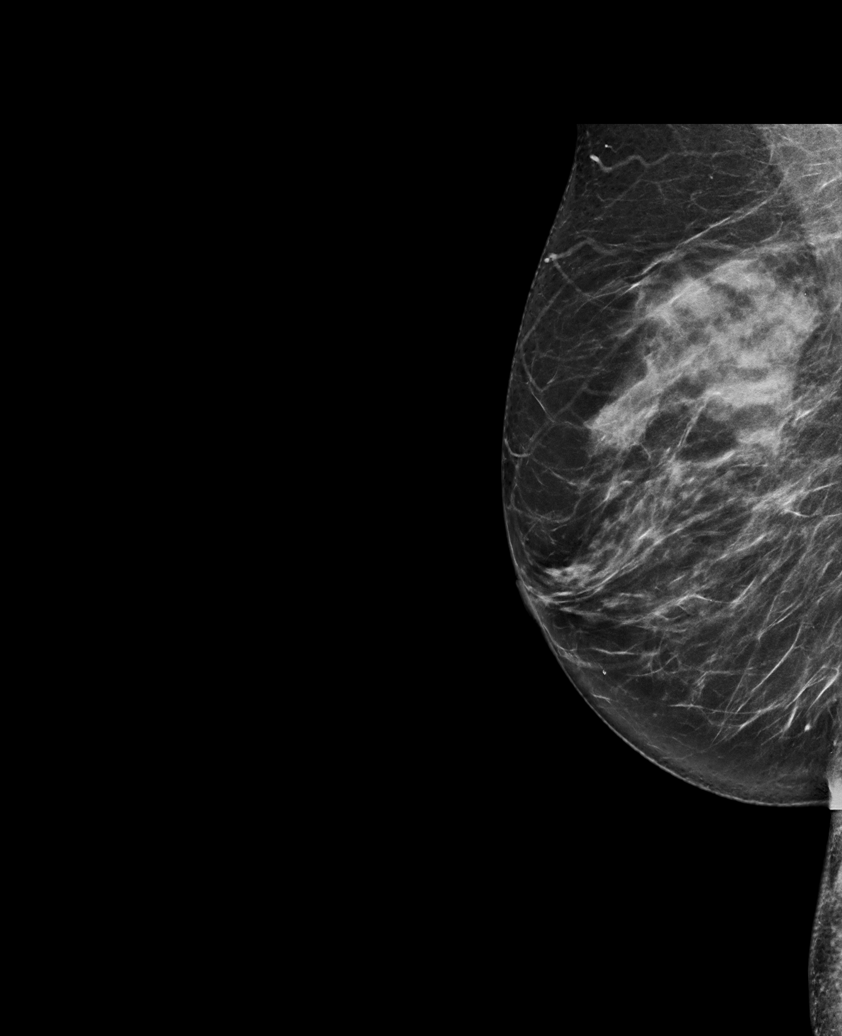

[6 of 36 positions shown; findings below may reference images not displayed]

ACR Breast Density Category c: The breast tissue is heterogeneously
dense, which may obscure small masses.
FINDINGS: No suspicious masses or calcifications are seen in either breast.
Probably benign asymmetry in the inner posterior left breast appears
unchanged. There is no mammographic evidence of malignancy.

Mammographic images were processed with CAD.
IMPRESSION: Stable probably benign left breast asymmetry. There is no
mammographic evidence of malignancy in either breast.

RECOMMENDATION:
Diagnostic mammography of the left breast August 2019 which will
demonstrate 2 years of stability of the probably benign left breast
asymmetry.

I have discussed the findings and recommendations with the patient.
If applicable, a reminder letter will be sent to the patient
regarding the next appointment.

BI-RADS CATEGORY  3: Probably benign.

## 2020-10-25 ENCOUNTER — Ambulatory Visit: Payer: Federal, State, Local not specified - PPO

## 2020-11-27 ENCOUNTER — Encounter: Payer: Self-pay | Admitting: Family Medicine

## 2020-12-16 ENCOUNTER — Ambulatory Visit
Admission: RE | Admit: 2020-12-16 | Discharge: 2020-12-16 | Disposition: A | Discharge status: Home / Self Care | Payer: Federal, State, Local not specified - PPO | Source: Ambulatory Visit | Attending: Osteopathic Medicine | Admitting: Osteopathic Medicine

## 2020-12-16 ENCOUNTER — Other Ambulatory Visit: Payer: Self-pay

## 2021-04-14 ENCOUNTER — Other Ambulatory Visit: Payer: Self-pay | Admitting: Osteopathic Medicine

## 2021-06-03 ENCOUNTER — Encounter: Payer: Self-pay | Admitting: Medical-Surgical

## 2021-06-03 ENCOUNTER — Ambulatory Visit: Payer: Federal, State, Local not specified - PPO | Admitting: Medical-Surgical

## 2021-06-03 ENCOUNTER — Other Ambulatory Visit: Payer: Self-pay

## 2021-06-03 VITALS — BP 134/77 | HR 73 | Resp 20 | Ht 62.0 in | Wt 188.1 lb

## 2021-06-03 DIAGNOSIS — Z09 Encounter for follow-up examination after completed treatment for conditions other than malignant neoplasm: Secondary | ICD-10-CM | POA: Diagnosis not present

## 2021-06-03 DIAGNOSIS — Z23 Encounter for immunization: Secondary | ICD-10-CM | POA: Diagnosis not present

## 2021-06-03 DIAGNOSIS — T782XXD Anaphylactic shock, unspecified, subsequent encounter: Secondary | ICD-10-CM | POA: Diagnosis not present

## 2021-06-03 DIAGNOSIS — Z1211 Encounter for screening for malignant neoplasm of colon: Secondary | ICD-10-CM

## 2021-06-03 MED ORDER — EPINEPHRINE 0.3 MG/0.3ML IJ SOAJ: 0.3000 mg | INTRAMUSCULAR | 5 refills | Status: DC | PRN

## 2021-06-03 NOTE — Progress Notes (Signed)
?  HPI with pertinent ROS:  ? ?CC: hospital discharge follow up ? ?HPI: ?Pleasant 66 year old female presenting today for a hospital discharge after having anaphylaxis. She has a known allergy to tree nuts and is usually very studious about avoiding them. She was at a bridal shower in [place] and took a cookie that had no outer indication that there may be nuts in it. Unfortunately, she took a bite and tasted Valla Leaver which is made from almonds. She immediately noted tingling in her mouth and took Benadryl. Her sister drove her to the ED where she was evaluated.  She had no throat swelling or difficulty breathing so she was treated with steroids, Pepcid, and Benadryl.  She is currently completing her 7-day course of medication.  She is doing much better.  Notes that the original hives that developed were sporadic and resolved quickly.  She usually carries Benadryl with her but would like to have an EpiPen to keep on hand just in case. ? ?I reviewed the past medical history, family history, social history, surgical history, and allergies today and no changes were needed.  Please see the problem list section below in epic for further details. ? ? ?Physical exam:  ? ?General: Well Developed, well nourished, and in no acute distress.  ?Neuro: Alert and oriented x3.  ?HEENT: Normocephalic, atraumatic.  ?Skin: Warm and dry. ?Cardiac: Regular rate and rhythm, no murmurs rubs or gallops, no lower extremity edema.  ?Respiratory: Clear to auscultation bilaterally. Not using accessory muscles, speaking in full sentences. ? ?Impression and Recommendations:   ? ?1. Needs flu shot ?Flu vaccine given in office today. ?- Flu Vaccine QUAD High Dose(Fluad) ? ?2. Colon cancer screening ?Discussed screening recommendations.  She would like to go with Cologuard so ordering that today. ?- Cologuard ? ?3. Hospital discharge follow-up ?4. Anaphylaxis, subsequent encounter ?She is doing well overall.  Continue to avoid tree nuts.  EpiPen  prescribed with plenty of refills.  Continue caring Benadryl as well. ? ?Return if symptoms worsen or fail to improve. ?___________________________________________ ?Thayer Ohm, DNP, APRN, FNP-BC ?Primary Care and Sports Medicine ?[facility] ?

## 2021-06-27 LAB — COLOGUARD: COLOGUARD: NEGATIVE

## 2021-06-28 ENCOUNTER — Other Ambulatory Visit: Payer: Self-pay | Admitting: Medical-Surgical

## 2021-07-01 ENCOUNTER — Inpatient Hospital Stay: Payer: Federal, State, Local not specified - PPO | Admitting: Medical-Surgical

## 2021-07-21 ENCOUNTER — Other Ambulatory Visit: Payer: Self-pay | Admitting: Osteopathic Medicine

## 2021-08-15 ENCOUNTER — Encounter: Payer: Self-pay | Admitting: Medical-Surgical

## 2021-08-15 ENCOUNTER — Ambulatory Visit (INDEPENDENT_AMBULATORY_CARE_PROVIDER_SITE_OTHER): Payer: Federal, State, Local not specified - PPO | Admitting: Medical-Surgical

## 2021-08-15 VITALS — BP 140/80 | HR 67 | Resp 20 | Ht 62.0 in | Wt 186.1 lb

## 2021-08-15 DIAGNOSIS — I10 Essential (primary) hypertension: Secondary | ICD-10-CM

## 2021-08-15 DIAGNOSIS — Z0000 Encounter for general adult medical examination without abnormal findings: Secondary | ICD-10-CM | POA: Diagnosis not present

## 2021-08-15 DIAGNOSIS — Z13820 Encounter for screening for osteoporosis: Secondary | ICD-10-CM | POA: Diagnosis not present

## 2021-08-15 DIAGNOSIS — Z23 Encounter for immunization: Secondary | ICD-10-CM | POA: Insufficient documentation

## 2021-08-15 DIAGNOSIS — E785 Hyperlipidemia, unspecified: Secondary | ICD-10-CM

## 2021-08-15 NOTE — Assessment & Plan Note (Addendum)
Blood pressure elevated on arrival today, recheck 140/80.  Had [place] yesterday followed by popcorn which could be contributing.  Continue HCTZ 25 mg daily.  Low-sodium diet, regular intentional exercise and weight loss recommended.

## 2021-08-15 NOTE — Progress Notes (Signed)
Complete physical exam  Patient: Hannah Hull   DOB: November 26, 1955   66 y.o. Female  MRN: [id number]  Subjective:    Chief Complaint  Patient presents with   Annual Exam   Hannah Hull is a 66 y.o. female who presents today for a complete physical exam. She reports consuming a general diet. Home exercise routine includes walking 0.5 hrs per day. She generally feels well. She reports sleeping well. She does not have additional problems to discuss today.    Most recent fall risk assessment:    08/15/2021   10:53 AM  Fall Risk   Falls in the past year? 0  Number falls in past yr: 0  Injury with Fall? 0  Risk for fall due to : No Fall Risks  Follow up Falls evaluation completed     Most recent depression screenings:    08/15/2021   10:53 AM 05/29/2020   10:00 AM  PHQ 2/9 Scores  PHQ - 2 Score 0 0    Vision:Within last year and Dental: No current dental problems and No regular dental care     No care team member to display   Outpatient Medications Prior to Visit  Medication Sig   EPINEPHrine 0.3 mg/0.3 mL IJ SOAJ injection Inject 0.3 mg into the muscle as needed for anaphylaxis.   esomeprazole (NEXIUM) 40 MG capsule TAKE 1 CAP BY MOUTH EVERY OTHER DAY. NO REFILLS. NEEDS TO TRANSFER CARE TO NEW PCP.   hydrochlorothiazide (HYDRODIURIL) 25 MG tablet TAKE 1 TABLET (25 MG TOTAL) BY MOUTH DAILY.   No facility-administered medications prior to visit.    Review of Systems  Constitutional:  Negative for chills, fever, malaise/fatigue and weight loss.  HENT:  Negative for congestion, ear pain, hearing loss, sinus pain and sore throat.   Eyes:  Negative for blurred vision, photophobia and pain.  Respiratory:  Negative for cough, shortness of breath and wheezing.   Cardiovascular:  Negative for chest pain, palpitations and leg swelling.  Gastrointestinal:  Negative for abdominal pain, constipation, diarrhea, heartburn, nausea and vomiting.  Genitourinary:  Negative for dysuria,  frequency and urgency.  Musculoskeletal:  Negative for falls and neck pain.  Skin:  Negative for itching and rash.  Neurological:  Negative for dizziness, weakness and headaches.  Endo/Heme/Allergies:  Negative for polydipsia. Does not bruise/bleed easily.  Psychiatric/Behavioral:  Negative for depression, substance abuse and suicidal ideas. The patient is not nervous/anxious and does not have insomnia.          Objective:     BP 140/80 (BP Location: Left Arm, Cuff Size: Normal)   Pulse 67   Resp 20   Ht 5\' 2"  (1.575 m)   Wt 186 lb 1.9 oz (84.4 kg)   SpO2 98%   BMI 34.04 kg/m    Physical Exam Constitutional:      General: She is not in acute distress.    Appearance: Normal appearance. She is obese. She is not ill-appearing.  HENT:     Head: Normocephalic and atraumatic.     Right Ear: Tympanic membrane normal.     Left Ear: Tympanic membrane normal.     Nose: Nose normal.     Mouth/Throat:     Mouth: Mucous membranes are moist.     Pharynx: No oropharyngeal exudate or posterior oropharyngeal erythema.  Eyes:     Extraocular Movements: Extraocular movements intact.     Conjunctiva/sclera: Conjunctivae normal.     Pupils: Pupils are equal, round, and reactive to light.  Neck:     Thyroid: No thyromegaly.     Vascular: No carotid bruit or JVD.     Trachea: Trachea normal.  Cardiovascular:     Rate and Rhythm: Normal rate and regular rhythm.     Pulses: Normal pulses.     Heart sounds: Normal heart sounds. No murmur heard.   No friction rub. No gallop.  Pulmonary:     Effort: Pulmonary effort is normal. No respiratory distress.     Breath sounds: Normal breath sounds. No wheezing.  Abdominal:     General: Bowel sounds are normal. There is no distension.     Palpations: Abdomen is soft.     Tenderness: There is no abdominal tenderness. There is no guarding.  Musculoskeletal:        General: Normal range of motion.     Cervical back: Normal range of motion and neck  supple.  Lymphadenopathy:     Cervical: No cervical adenopathy.  Skin:    General: Skin is warm and dry.  Neurological:     Mental Status: She is alert and oriented to person, place, and time.     Cranial Nerves: No cranial nerve deficit.  Psychiatric:        Mood and Affect: Mood normal.        Behavior: Behavior normal.        Thought Content: Thought content normal.        Judgment: Judgment normal.     No results found for any visits on 08/15/21.     Assessment & Plan:    Routine Health Maintenance and Physical Exam  Immunization History  Administered Date(s) Administered   Fluad Quad(high Dose 65+) 06/03/2021   Influenza,inj,Quad PF,6+ Mos 01/24/2019   Moderna Sars-Covid-2 Vaccination 06/26/2019, 07/23/2019   Pneumococcal Conjugate-13 08/15/2021    Health Maintenance  Topic Date Due   DEXA SCAN  Never done   PAP SMEAR-Modifier  08/15/2021 (Originally 02/20/1977)   COVID-19 Vaccine (3 - Booster for Moderna series) 08/31/2021 (Originally 09/17/2019)   Zoster Vaccines- Shingrix (1 of 2) 11/15/2021 (Originally 02/20/2006)   TETANUS/TDAP  08/16/2022 (Originally 02/21/1975)   INFLUENZA VACCINE  10/28/2021   Pneumonia Vaccine 44+ Years old (2 - PPSV23 if available, else PCV20) 08/16/2022   MAMMOGRAM  12/17/2022   COLONOSCOPY (Pts 45-44yrs Insurance coverage will need to be confirmed)  06/20/2031   HPV VACCINES  Aged Out   Hepatitis C Screening  Discontinued   HIV Screening  Discontinued    Discussed health benefits of physical activity, and encouraged her to engage in regular exercise appropriate for her age and condition.  Problem List Items Addressed This Visit       Cardiovascular and Mediastinum   Essential hypertension    Blood pressure elevated on arrival today, recheck 140/80.  Had [place] yesterday followed by popcorn which could be contributing.  Continue HCTZ 25 mg daily.  Low-sodium diet, regular intentional exercise and weight loss recommended.          Other   Osteoporosis screening    DEXA scan ordered.       Relevant Orders   DG Bone Density   Need for vaccination for Strep pneumoniae    Prevnar 13 given in office today.  Patient advised that she will be due for Pneumovax 23 in 1 year.       Relevant Orders   Pneumococcal conjugate vaccine 13-valent IM (Completed)   Annual physical exam - Primary    Checking labs as below.  Up-to-date on preventative care outside of dental care.  Recommend finding a dentist and attending cleaning appointments every 6 months with as needed care in between.  Wellness information provided with AVS.       Relevant Orders   Lipid panel   COMPLETE METABOLIC PANEL WITH GFR   CBC with Differential/Platelet   Return in about 2 weeks (around 08/29/2021) for nurse visit for BP check.  __________________________________________ Thayer Ohm, DNP, APRN, FNP-BC Primary Care and Sports Medicine [facility] [place]

## 2021-08-15 NOTE — Assessment & Plan Note (Signed)
Checking labs as below.  Up-to-date on preventative care outside of dental care.  Recommend finding a dentist and attending cleaning appointments every 6 months with as needed care in between.  Wellness information provided with AVS.

## 2021-08-15 NOTE — Assessment & Plan Note (Signed)
Prevnar 13 given in office today.  Patient advised that she will be due for Pneumovax 23 in 1 year.

## 2021-08-15 NOTE — Assessment & Plan Note (Signed)
-   DEXA scan ordered

## 2021-08-16 LAB — COMPLETE METABOLIC PANEL WITH GFR
AG Ratio: 1.5 (calc) (ref 1.0–2.5)
ALT: 14 U/L (ref 6–29)
AST: 15 U/L (ref 10–35)
Albumin: 4.1 g/dL (ref 3.6–5.1)
Alkaline phosphatase (APISO): 85 U/L (ref 37–153)
BUN: 18 mg/dL (ref 7–25)
CO2: 26 mmol/L (ref 20–32)
Calcium: 9.3 mg/dL (ref 8.6–10.4)
Chloride: 101 mmol/L (ref 98–110)
Creat: 0.83 mg/dL (ref 0.50–1.05)
Globulin: 2.7 g/dL (calc) (ref 1.9–3.7)
Glucose, Bld: 78 mg/dL (ref 65–99)
Potassium: 3.9 mmol/L (ref 3.5–5.3)
Sodium: 140 mmol/L (ref 135–146)
Total Bilirubin: 0.6 mg/dL (ref 0.2–1.2)
Total Protein: 6.8 g/dL (ref 6.1–8.1)
eGFR: 78 mL/min/{1.73_m2} (ref >=60)

## 2021-08-16 LAB — CBC WITH DIFFERENTIAL/PLATELET
Absolute Monocytes: 570 cells/uL (ref 200–950)
Basophils Absolute: 61 cells/uL (ref 0–200)
Basophils Relative: 0.8 %
Eosinophils Absolute: 350 cells/uL (ref 15–500)
Eosinophils Relative: 4.6 %
HCT: 39 % (ref 35.0–45.0)
Hemoglobin: 12.6 g/dL (ref 11.7–15.5)
Lymphs Abs: 2052 cells/uL (ref 850–3900)
MCH: 26 pg — ABNORMAL LOW (ref 27.0–33.0)
MCHC: 32.3 g/dL (ref 32.0–36.0)
MCV: 80.6 fL (ref 80.0–100.0)
MPV: 11.7 fL (ref 7.5–12.5)
Monocytes Relative: 7.5 %
Neutro Abs: 4568 cells/uL (ref 1500–7800)
Neutrophils Relative %: 60.1 %
Platelets: 288 10*3/uL (ref 140–400)
RBC: 4.84 10*6/uL (ref 3.80–5.10)
RDW: 13.1 % (ref 11.0–15.0)
Total Lymphocyte: 27 %
WBC: 7.6 10*3/uL (ref 3.8–10.8)

## 2021-08-16 LAB — LIPID PANEL
Cholesterol: 215 mg/dL — ABNORMAL HIGH (ref <200)
HDL: 76 mg/dL (ref >=50)
LDL Cholesterol (Calc): 120 mg/dL (calc) — ABNORMAL HIGH
Non-HDL Cholesterol (Calc): 139 mg/dL (calc) — ABNORMAL HIGH (ref <130)
Total CHOL/HDL Ratio: 2.8 (calc) (ref <5.0)
Triglycerides: 89 mg/dL (ref <150)

## 2021-08-18 MED ORDER — ATORVASTATIN CALCIUM 10 MG PO TABS: 10.0000 mg | ORAL_TABLET | Freq: Every day | ORAL | 3 refills | Status: DC

## 2021-08-18 NOTE — Addendum Note (Signed)
Addended byChristen Butter on: 08/18/2021 05:21 PM   Modules accepted: Orders

## 2021-08-29 ENCOUNTER — Ambulatory Visit (INDEPENDENT_AMBULATORY_CARE_PROVIDER_SITE_OTHER): Payer: Federal, State, Local not specified - PPO | Admitting: Medical-Surgical

## 2021-08-29 VITALS — BP 134/67 | HR 79 | Resp 16 | Ht 62.0 in | Wt 186.0 lb

## 2021-08-29 DIAGNOSIS — I10 Essential (primary) hypertension: Secondary | ICD-10-CM

## 2021-08-29 NOTE — Progress Notes (Signed)
Agree with documentation as below.  Continue HCTZ 25 mg daily as prescribed.  Blood pressure well controlled so we will plan to follow-up in 6 months. ___________________________________________ Clearnce Sorrel, DNP, APRN, FNP-BC Primary Care and [place]

## 2021-08-29 NOTE — Progress Notes (Signed)
Patient in office for Nurse Visit for BP recheck. Patient denies headaches, palpitations, chest pain or dizziness. Patient currently taking HCTZ 25 mg daily. Patients BP in office today is 134/67.

## 2021-08-29 NOTE — Progress Notes (Signed)
Pt informed.  Pt expressed understanding and is agreeable.  T. Hulen Mandler, CMA  

## 2021-09-24 ENCOUNTER — Ambulatory Visit (INDEPENDENT_AMBULATORY_CARE_PROVIDER_SITE_OTHER): Payer: Federal, State, Local not specified - PPO

## 2021-09-24 DIAGNOSIS — Z13820 Encounter for screening for osteoporosis: Secondary | ICD-10-CM | POA: Diagnosis not present

## 2021-09-25 ENCOUNTER — Encounter: Payer: Self-pay | Admitting: Medical-Surgical

## 2021-09-25 DIAGNOSIS — M810 Age-related osteoporosis without current pathological fracture: Secondary | ICD-10-CM | POA: Insufficient documentation

## 2021-09-25 MED ORDER — ALENDRONATE SODIUM 10 MG PO TABS: 10.0000 mg | ORAL_TABLET | Freq: Every day | ORAL | 3 refills | Status: DC

## 2021-09-25 NOTE — Progress Notes (Unsigned)
Fosamax 10 mg daily sent to the pharmacy.  Please make sure the patient takes this by itself first thing in the morning on empty stomach with a full glass of water (at least 8 ounces).  She should remain upright for at least 30 minutes after taking this pill. ___________________________________________ Thayer Ohm, DNP, APRN, FNP-BC Primary Care and Sports Medicine [facility] [place]

## 2021-09-29 NOTE — Progress Notes (Signed)
Patient advised.

## 2021-10-04 LAB — COMPLETE METABOLIC PANEL WITH GFR
AG Ratio: 1.5 (calc) (ref 1.0–2.5)
ALT: 14 U/L (ref 6–29)
AST: 15 U/L (ref 10–35)
Albumin: 4.1 g/dL (ref 3.6–5.1)
Alkaline phosphatase (APISO): 91 U/L (ref 37–153)
BUN: 15 mg/dL (ref 7–25)
CO2: 30 mmol/L (ref 20–32)
Calcium: 9.3 mg/dL (ref 8.6–10.4)
Chloride: 101 mmol/L (ref 98–110)
Creat: 0.85 mg/dL (ref 0.50–1.05)
Globulin: 2.7 g/dL (calc) (ref 1.9–3.7)
Glucose, Bld: 87 mg/dL (ref 65–99)
Potassium: 3.7 mmol/L (ref 3.5–5.3)
Sodium: 140 mmol/L (ref 135–146)
Total Bilirubin: 0.6 mg/dL (ref 0.2–1.2)
Total Protein: 6.8 g/dL (ref 6.1–8.1)
eGFR: 76 mL/min/{1.73_m2} (ref >=60)

## 2021-10-04 LAB — LIPID PANEL
Cholesterol: 159 mg/dL (ref <200)
HDL: 65 mg/dL (ref >=50)
LDL Cholesterol (Calc): 76 mg/dL (calc)
Non-HDL Cholesterol (Calc): 94 mg/dL (calc) (ref <130)
Total CHOL/HDL Ratio: 2.4 (calc) (ref <5.0)
Triglycerides: 98 mg/dL (ref <150)

## 2021-10-18 ENCOUNTER — Other Ambulatory Visit: Payer: Self-pay | Admitting: Medical-Surgical

## 2021-11-11 ENCOUNTER — Other Ambulatory Visit: Payer: Self-pay | Admitting: Medical-Surgical

## 2021-11-11 DIAGNOSIS — Z1231 Encounter for screening mammogram for malignant neoplasm of breast: Secondary | ICD-10-CM

## 2021-12-08 ENCOUNTER — Other Ambulatory Visit: Payer: Self-pay | Admitting: Medical-Surgical

## 2021-12-19 ENCOUNTER — Ambulatory Visit: Payer: Federal, State, Local not specified - PPO

## 2021-12-22 ENCOUNTER — Ambulatory Visit
Admission: RE | Admit: 2021-12-22 | Discharge: 2021-12-22 | Disposition: A | Discharge status: Home / Self Care | Payer: Federal, State, Local not specified - PPO | Source: Ambulatory Visit | Attending: Medical-Surgical | Admitting: Medical-Surgical

## 2021-12-22 DIAGNOSIS — Z1231 Encounter for screening mammogram for malignant neoplasm of breast: Secondary | ICD-10-CM

## 2022-04-18 ENCOUNTER — Other Ambulatory Visit: Payer: Self-pay | Admitting: Medical-Surgical

## 2022-04-18 DIAGNOSIS — I10 Essential (primary) hypertension: Secondary | ICD-10-CM

## 2022-07-31 ENCOUNTER — Other Ambulatory Visit: Payer: Self-pay | Admitting: Medical-Surgical

## 2022-08-08 ENCOUNTER — Other Ambulatory Visit: Payer: Self-pay | Admitting: Medical-Surgical

## 2022-09-17 ENCOUNTER — Other Ambulatory Visit: Payer: Self-pay | Admitting: Medical-Surgical

## 2022-10-08 ENCOUNTER — Other Ambulatory Visit: Payer: Self-pay | Admitting: Medical-Surgical

## 2022-10-16 ENCOUNTER — Other Ambulatory Visit: Payer: Self-pay | Admitting: Medical-Surgical

## 2022-10-16 DIAGNOSIS — I10 Essential (primary) hypertension: Secondary | ICD-10-CM

## 2022-10-19 NOTE — Telephone Encounter (Signed)
Upcoming appointment 10/21/2022

## 2022-10-19 NOTE — Telephone Encounter (Signed)
30-day refill of hydrochlorothiazide sent to get her to her upcoming appointment.  No further refills

## 2022-10-21 ENCOUNTER — Encounter: Payer: Self-pay | Admitting: Medical-Surgical

## 2022-10-21 ENCOUNTER — Ambulatory Visit (INDEPENDENT_AMBULATORY_CARE_PROVIDER_SITE_OTHER): Payer: Federal, State, Local not specified - PPO | Admitting: Medical-Surgical

## 2022-10-21 VITALS — BP 123/83 | HR 70 | Resp 20 | Ht 62.0 in | Wt 185.1 lb

## 2022-10-21 DIAGNOSIS — T782XXD Anaphylactic shock, unspecified, subsequent encounter: Secondary | ICD-10-CM

## 2022-10-21 DIAGNOSIS — Z0000 Encounter for general adult medical examination without abnormal findings: Secondary | ICD-10-CM | POA: Diagnosis not present

## 2022-10-21 DIAGNOSIS — M810 Age-related osteoporosis without current pathological fracture: Secondary | ICD-10-CM

## 2022-10-21 DIAGNOSIS — E785 Hyperlipidemia, unspecified: Secondary | ICD-10-CM | POA: Diagnosis not present

## 2022-10-21 DIAGNOSIS — I10 Essential (primary) hypertension: Secondary | ICD-10-CM | POA: Diagnosis not present

## 2022-10-21 DIAGNOSIS — Z23 Encounter for immunization: Secondary | ICD-10-CM | POA: Diagnosis not present

## 2022-10-21 MED ORDER — HYDROCHLOROTHIAZIDE 25 MG PO TABS
25.0000 mg | ORAL_TABLET | Freq: Every day | ORAL | 3 refills | Status: DC
Start: 2022-10-21 — End: 2023-10-22

## 2022-10-21 MED ORDER — ESOMEPRAZOLE MAGNESIUM 40 MG PO CPDR: 40.0000 mg | DELAYED_RELEASE_CAPSULE | ORAL | 3 refills | Status: DC

## 2022-10-21 MED ORDER — ALENDRONATE SODIUM 10 MG PO TABS: 10.0000 mg | ORAL_TABLET | Freq: Every day | ORAL | 3 refills | Status: DC

## 2022-10-21 MED ORDER — ATORVASTATIN CALCIUM 10 MG PO TABS: 10.0000 mg | ORAL_TABLET | Freq: Every day | ORAL | 3 refills | Status: DC

## 2022-10-21 MED ORDER — EPINEPHRINE 0.3 MG/0.3ML IJ SOAJ: 0.3000 mg | INTRAMUSCULAR | 5 refills | Status: DC | PRN

## 2022-10-21 NOTE — Progress Notes (Signed)
Complete physical exam  Patient: Hannah Hull   DOB: [birth year]   68 y.o. Female  MRN: [id number]  Subjective:    Chief Complaint  Patient presents with   Annual Exam    Hannah Hull is a 67 y.o. female who presents today for a complete physical exam. She reports consuming a general diet.  Some walking for exerise  She generally feels well. She reports sleeping well. She does not have additional problems to discuss today.    Most recent fall risk assessment:    10/21/2022    8:32 AM  Fall Risk   Falls in the past year? 0  Number falls in past yr: 0  Injury with Fall? 0  Risk for fall due to : No Fall Risks  Follow up Falls evaluation completed     Most recent depression screenings:    10/21/2022    8:32 AM 08/15/2021   10:53 AM  PHQ 2/9 Scores  PHQ - 2 Score 0 0    Vision:Within last year, Dental: No current dental problems and Receives regular dental care, and STD: The patient denies history of sexually transmitted disease.    Patient Care Team: Christen Butter, NP as PCP - General (Nurse Practitioner)   Outpatient Medications Prior to Visit  Medication Sig   [DISCONTINUED] alendronate (FOSAMAX) 10 MG tablet Take 1 tablet (10 mg total) by mouth daily before breakfast. NO REFILLS. LAST VISIT >66YR. NEEDS AN APPT.   [DISCONTINUED] atorvastatin (LIPITOR) 10 MG tablet Take 1 tablet (10 mg total) by mouth daily. NEEDS APPOINTMENT FOR FURTHER REFILLS.   [DISCONTINUED] EPINEPHrine 0.3 mg/0.3 mL IJ SOAJ injection Inject 0.3 mg into the muscle as needed for anaphylaxis.   [DISCONTINUED] esomeprazole (NEXIUM) 40 MG capsule Take 1 capsule (40 mg total) by mouth every other day. NEEDS APPOINTMENT FOR FURTHER REFILLS.   [DISCONTINUED] hydrochlorothiazide (HYDRODIURIL) 25 MG tablet TAKE 1 TABLET (25 MG TOTAL) BY MOUTH DAILY.   No facility-administered medications prior to visit.   Review of Systems  Constitutional:  Negative for chills, fever, malaise/fatigue and weight loss.   HENT:  Negative for congestion, ear pain, hearing loss, sinus pain and sore throat.   Eyes:  Negative for blurred vision, photophobia and pain.  Respiratory:  Negative for cough, shortness of breath and wheezing.   Cardiovascular:  Negative for chest pain, palpitations and leg swelling.  Gastrointestinal:  Negative for abdominal pain, constipation, diarrhea, heartburn, nausea and vomiting.  Genitourinary:  Negative for dysuria, frequency and urgency.  Musculoskeletal:  Negative for falls and neck pain.  Skin:  Negative for itching and rash.  Neurological:  Negative for dizziness, weakness and headaches.  Endo/Heme/Allergies:  Negative for polydipsia. Does not bruise/bleed easily.  Psychiatric/Behavioral:  Negative for depression, substance abuse and suicidal ideas. The patient is not nervous/anxious.      Objective:    BP 123/83 (BP Location: Left Arm, Cuff Size: Normal)   Pulse 70   Resp 20   Ht 5\' 2"  (1.575 m)   Wt 185 lb 1.9 oz (84 kg)   SpO2 97%   BMI 33.86 kg/m    Physical Exam Constitutional:      General: She is not in acute distress.    Appearance: Normal appearance. She is not ill-appearing.  HENT:     Head: Normocephalic and atraumatic.     Right Ear: Tympanic membrane, ear canal and external ear normal. There is no impacted cerumen.     Left Ear: Tympanic membrane, ear canal and external  ear normal. There is no impacted cerumen.     Nose: Nose normal. No congestion or rhinorrhea.     Mouth/Throat:     Mouth: Mucous membranes are moist.     Pharynx: No oropharyngeal exudate or posterior oropharyngeal erythema.     Comments: Tongue with dark discoloration secondary to chewable Pepto-Bismol taken yesterday x 2 Eyes:     General: No scleral icterus.       Right eye: No discharge.        Left eye: No discharge.     Extraocular Movements: Extraocular movements intact.     Conjunctiva/sclera: Conjunctivae normal.     Pupils: Pupils are equal, round, and reactive to  light.  Neck:     Thyroid: No thyromegaly.     Vascular: No carotid bruit or JVD.     Trachea: Trachea normal.  Cardiovascular:     Rate and Rhythm: Normal rate and regular rhythm.     Pulses: Normal pulses.     Heart sounds: Normal heart sounds. No murmur heard.    No friction rub. No gallop.  Pulmonary:     Effort: Pulmonary effort is normal. No respiratory distress.     Breath sounds: Normal breath sounds. No wheezing.  Abdominal:     General: Bowel sounds are normal. There is no distension.     Palpations: Abdomen is soft.     Tenderness: There is no abdominal tenderness. There is no guarding.  Musculoskeletal:        General: Normal range of motion.     Cervical back: Normal range of motion and neck supple.  Lymphadenopathy:     Cervical: No cervical adenopathy.  Skin:    General: Skin is warm and dry.  Neurological:     Mental Status: She is alert and oriented to person, place, and time.     Cranial Nerves: No cranial nerve deficit.  Psychiatric:        Mood and Affect: Mood normal.        Behavior: Behavior normal.        Thought Content: Thought content normal.        Judgment: Judgment normal.   No results found for any visits on 10/21/22.     Assessment & Plan:    Routine Health Maintenance and Physical Exam  Immunization History  Administered Date(s) Administered   Fluad Quad(high Dose 65+) 06/03/2021   Influenza,inj,Quad PF,6+ Mos 01/24/2019   Moderna Sars-Covid-2 Vaccination 06/26/2019, 07/23/2019   PNEUMOCOCCAL CONJUGATE-20 10/21/2022   Pneumococcal Conjugate-13 08/15/2021    Health Maintenance  Topic Date Due   DTaP/Tdap/Td (1 - Tdap) Never done   Zoster Vaccines- Shingrix (1 of 2) Never done   COVID-19 Vaccine (3 - 2023-24 season) 11/06/2022 (Originally 11/28/2021)   INFLUENZA VACCINE  10/29/2022   MAMMOGRAM  12/23/2023   Colonoscopy  06/20/2031   Pneumonia Vaccine 67+ Years old  Completed   DEXA SCAN  Completed   HPV VACCINES  Aged Out    Hepatitis C Screening  Discontinued    Discussed health benefits of physical activity, and encouraged her to engage in regular exercise appropriate for her age and condition.  1. Annual physical exam Checking labs as below.  Up-to-date on preventative care.  Wellness information provided with AVS. - Lipid panel - CBC with Differential/Platelet - CMP14+EGFR  2. Essential hypertension Checking labs as below.  Blood pressure at goal.  Continue HCTZ 25 mg daily. - Lipid panel - CBC with Differential/Platelet - hydrochlorothiazide (HYDRODIURIL)  25 MG tablet; Take 1 tablet (25 mg total) by mouth daily.  Dispense: 90 tablet; Refill: 3 - CMP14+EGFR  3. Age-related osteoporosis without current pathological fracture DEXA scan ordered.  4. Need for vaccination for Strep pneumoniae Prevnar 20 given in office today.  She will be due for her next pneumonia vaccine in 5 years.  5. Hyperlipidemia, unspecified hyperlipidemia type Checking lipids today.  Continue atorvastatin 10 mg daily.  6. Anaphylaxis, subsequent encounter Refilling EpiPen.  Return in about 1 year (around 10/21/2023) for annual physical exam or sooner if needed.   Christen Butter, NP

## 2022-10-22 LAB — CBC WITH DIFFERENTIAL/PLATELET
Basophils Absolute: 0.1 10*3/uL (ref 0.0–0.2)
EOS (ABSOLUTE): 0.3 10*3/uL (ref 0.0–0.4)
Eos: 4 %
Hematocrit: 36.7 % (ref 34.0–46.6)
Hemoglobin: 12.3 g/dL (ref 11.1–15.9)
Immature Grans (Abs): 0 10*3/uL (ref 0.0–0.1)
Immature Granulocytes: 0 %
Lymphocytes Absolute: 2.1 10*3/uL (ref 0.7–3.1)
Lymphs: 26 %
MCH: 26.9 pg (ref 26.6–33.0)
MCHC: 33.5 g/dL (ref 31.5–35.7)
MCV: 80 fL (ref 79–97)
Monocytes: 8 %
Neutrophils Absolute: 4.9 10*3/uL (ref 1.4–7.0)
Neutrophils: 61 %
RBC: 4.58 x10E6/uL (ref 3.77–5.28)
RDW: 13.1 % (ref 11.7–15.4)
WBC: 8.1 10*3/uL (ref 3.4–10.8)

## 2022-10-22 LAB — CMP14+EGFR
ALT: 19 IU/L (ref 0–32)
AST: 22 IU/L (ref 0–40)
Albumin: 4.2 g/dL (ref 3.9–4.9)
Alkaline Phosphatase: 108 IU/L (ref 44–121)
BUN/Creatinine Ratio: 26 (ref 12–28)
BUN: 20 mg/dL (ref 8–27)
Bilirubin Total: 0.5 mg/dL (ref 0.0–1.2)
CO2: 25 mmol/L (ref 20–29)
Calcium: 9.2 mg/dL (ref 8.7–10.3)
Creatinine, Ser: 0.76 mg/dL (ref 0.57–1.00)
Glucose: 95 mg/dL (ref 70–99)
Potassium: 3.6 mmol/L (ref 3.5–5.2)
Sodium: 140 mmol/L (ref 134–144)
Total Protein: 6.9 g/dL (ref 6.0–8.5)
eGFR: 86 mL/min/{1.73_m2} (ref >59)

## 2022-10-22 LAB — LIPID PANEL
Chol/HDL Ratio: 2 ratio (ref 0.0–4.4)
Cholesterol, Total: 160 mg/dL (ref 100–199)
HDL: 79 mg/dL (ref >39)
Triglycerides: 82 mg/dL (ref 0–149)
VLDL Cholesterol Cal: 15 mg/dL (ref 5–40)

## 2023-01-26 ENCOUNTER — Other Ambulatory Visit: Payer: Self-pay | Admitting: Medical-Surgical

## 2023-01-26 DIAGNOSIS — Z1231 Encounter for screening mammogram for malignant neoplasm of breast: Secondary | ICD-10-CM

## 2023-03-03 ENCOUNTER — Ambulatory Visit: Payer: Federal, State, Local not specified - PPO

## 2023-03-12 ENCOUNTER — Ambulatory Visit
Admission: RE | Admit: 2023-03-12 | Discharge: 2023-03-12 | Disposition: A | Discharge status: Home / Self Care | Payer: Federal, State, Local not specified - PPO | Source: Ambulatory Visit | Attending: Medical-Surgical | Admitting: Medical-Surgical

## 2023-03-12 DIAGNOSIS — Z1231 Encounter for screening mammogram for malignant neoplasm of breast: Secondary | ICD-10-CM

## 2023-07-13 ENCOUNTER — Ambulatory Visit: Payer: Self-pay

## 2023-07-13 NOTE — Telephone Encounter (Signed)
 Copied from CRM [id number]. Topic: Clinical - Red Word Triage >> Jul 13, 2023 10:15 AM Brynn Caras wrote: Red Word that prompted transfer to Nurse Triage: On 04/05 the patient was returning from a trip and she states she had a fall that resulted in her dislocating her pinky finger on the left hand, she attempted to treat this herself but it's still crooked and bent. The patient confirms there is no soreness or pain at this time but she's concerned with the way it appears.   Chief Complaint: Finger injury Symptoms: Left pinky finger injury  Frequency: Single injury  Pertinent Negatives: Patient denies pain at this time  Disposition: [] ED /[] Urgent Care (no appt availability in office) / [x] Appointment(In office/virtual)/ []  [facility]/ [] Home Care/ [] Refused Recommended Disposition /[] [facility]/ []  Follow-up with PCP Additional Notes: Patient reports that on /5/25 she fell and injured her left pinky finger, stating that it dislocated at the time of the fall. She states that she reduced the dislocation herself but has not been seen for the injury. She states that her pinky is now crooked and she would like to have it evaluated. Appointment made for the patient tomorrow for evaluation.     Reason for Disposition  [1] After 1 week AND [2] not using the finger normally  Answer Assessment - Initial Assessment Questions 1. MECHANISM: "How did the injury happen?"      Fall 2. ONSET: "When did the injury happen?" (Minutes or hours ago)      07/03/23 3. LOCATION: "What part of the finger is injured?" "Is the nail damaged?"      Left pinky finger  4. APPEARANCE of the INJURY: "What does the injury look like?"      Looks crooked now  5. SEVERITY: "Can you use the hand normally?"  "Can you bend your fingers into a ball and then fully open them?"     Can bend finger  6. SIZE: For cuts, bruises, or swelling, ask: "How large is it?" (e.g., inches or centimeters;  entire finger)       N/A 7. PAIN: "Is there pain?" If Yes, ask: "How bad is the pain?"    (e.g., Scale 1-10; or mild, moderate, severe)  - NONE (0): no pain.  - MILD (1-3): doesn't interfere with normal activities.   - MODERATE (4-7): interferes with normal activities or awakens from sleep.  - SEVERE (8-10): excruciating pain, unable to hold a glass of water or bend finger even a little.     Mild  Protocols used: Finger Injury-A-AH

## 2023-07-13 NOTE — Telephone Encounter (Signed)
 Appt schld 07/14/22  with Cherre Cornish, NP .

## 2023-07-14 ENCOUNTER — Ambulatory Visit: Admitting: Medical-Surgical

## 2023-07-14 ENCOUNTER — Ambulatory Visit

## 2023-07-14 ENCOUNTER — Encounter: Payer: Self-pay | Admitting: Medical-Surgical

## 2023-07-14 VITALS — BP 124/78 | HR 72 | Resp 20 | Ht 62.0 in | Wt 183.3 lb

## 2023-07-14 DIAGNOSIS — W1789XA Other fall from one level to another, initial encounter: Secondary | ICD-10-CM | POA: Diagnosis not present

## 2023-07-14 DIAGNOSIS — S6992XA Unspecified injury of left wrist, hand and finger(s), initial encounter: Secondary | ICD-10-CM | POA: Diagnosis not present

## 2023-07-14 DIAGNOSIS — I10 Essential (primary) hypertension: Secondary | ICD-10-CM

## 2023-07-14 DIAGNOSIS — S62617A Displaced fracture of proximal phalanx of left little finger, initial encounter for closed fracture: Secondary | ICD-10-CM

## 2023-07-14 NOTE — Progress Notes (Signed)
        Established patient visit  History, exam, impression, and plan:  1. Essential hypertension (Primary) Pleasant 68 year old female presenting today with a history of essential hypertension.  Currently taking hydrochlorothiazide 25 mg daily, tolerating well without side effects.  Reports that she did not get her medication this morning and thinks that is why her blood pressure is elevated on arrival.  Denies any concerning symptoms.  Recheck of sugar after the appointment showed blood pressure is at goal.  Recommend continuing HCTZ as prescribed.  2. Finger injury, left, initial encounter She and her family had gone out of town for an event that was scheduled on 4/5.  They were on the way home and out of state on 4/6 when they stopped at her breast area.  When she got out of her car, she felt very stiff and unfortunately, had a fall.  She reports that her left pinky finger was "dislocated".  Once that she was able to get up, she reports that she popped it back into place and "relocated" the finger herself.  She has been icing it all week however is now very concerned because her pinky finger will not straighten completely.  Originally reports that her left hand was swollen after the incident however the hand swelling has gone down.  The finger swelling has improved however at the site of pain, it remains quite swollen.  She has some bruising noted to the fourth finger on the left hand but no break in skin or abnormal appearance to the digit.  On exam, capillary refill brisk in the finger is warm to touch.  Swelling at the base of the digit between the MCP joint and the PIP joint.  Palpation of the area between the 2 joints produces tenderness.  Unable to fully extend or flex the digit actively.  Passive attempts at range of motion for flexion and extension very painful.  Plan for x-rays today.  Okay to continue icing.  Discussed bracing.  Plan to see what the x-ray shows to make further  recommendations.  Offered pain medication however this was declined and reports her pain is only approximately 3/10 and very manageable. - DG Hand Complete Left; Future  Update: X-ray images reviewed with Dr. Sandy Crumb.  The area of tenderness coincides with an acute mildly displaced and mildly to moderately angulated fracture of the proximal metaphysis of the proximal phalanx of the fifth finger.  At this point, suspect she partially reduced the fracture on her own at the time of injury however with the degree of palmar apex angulation, she will need urgent further follow-up.  Urgent referral placed to orthopedic surgery.  When called with this information, patient did question if it was okay to wait since she is planning to go out of town again next week.  She was advised that I recommend having this addressed now rather than waiting until she gets back. - Ambulatory referral to Orthopedic Surgery  Procedures performed this visit: None.  Return if symptoms worsen or fail to improve.  __________________________________ Maryl Snook, DNP, APRN, FNP-BC Primary Care and Sports Medicine [facility] [place]

## 2023-07-31 ENCOUNTER — Other Ambulatory Visit: Payer: Self-pay | Admitting: Medical-Surgical

## 2023-10-22 ENCOUNTER — Encounter: Payer: Self-pay | Admitting: Medical-Surgical

## 2023-10-22 ENCOUNTER — Ambulatory Visit (INDEPENDENT_AMBULATORY_CARE_PROVIDER_SITE_OTHER): Admitting: Medical-Surgical

## 2023-10-22 VITALS — BP 136/78 | HR 57 | Resp 20 | Ht 62.0 in | Wt 188.4 lb

## 2023-10-22 DIAGNOSIS — I10 Essential (primary) hypertension: Secondary | ICD-10-CM

## 2023-10-22 DIAGNOSIS — T782XXA Anaphylactic shock, unspecified, initial encounter: Secondary | ICD-10-CM | POA: Insufficient documentation

## 2023-10-22 DIAGNOSIS — M810 Age-related osteoporosis without current pathological fracture: Secondary | ICD-10-CM

## 2023-10-22 DIAGNOSIS — Z23 Encounter for immunization: Secondary | ICD-10-CM | POA: Diagnosis not present

## 2023-10-22 DIAGNOSIS — T782XXD Anaphylactic shock, unspecified, subsequent encounter: Secondary | ICD-10-CM

## 2023-10-22 DIAGNOSIS — E785 Hyperlipidemia, unspecified: Secondary | ICD-10-CM | POA: Diagnosis not present

## 2023-10-22 DIAGNOSIS — Z0000 Encounter for general adult medical examination without abnormal findings: Secondary | ICD-10-CM

## 2023-10-22 MED ORDER — ATORVASTATIN CALCIUM 10 MG PO TABS: 10.0000 mg | ORAL_TABLET | Freq: Every day | ORAL | 3 refills | Status: AC

## 2023-10-22 MED ORDER — HYDROCHLOROTHIAZIDE 25 MG PO TABS: 25.0000 mg | ORAL_TABLET | Freq: Every day | ORAL | 3 refills | Status: AC

## 2023-10-22 MED ORDER — ALENDRONATE SODIUM 10 MG PO TABS: 10.0000 mg | ORAL_TABLET | Freq: Every day | ORAL | 3 refills | Status: AC

## 2023-10-22 MED ORDER — EPINEPHRINE 0.3 MG/0.3ML IJ SOAJ
0.3000 mg | INTRAMUSCULAR | 5 refills | Status: AC | PRN
Start: 2023-10-22 — End: ?

## 2023-10-22 NOTE — Patient Instructions (Signed)
 Preventive Care 43 Years and Older, Female Preventive care refers to lifestyle choices and visits with your health care provider that can promote health and wellness. Preventive care visits are also called wellness exams. What can I expect for my preventive care visit? Counseling Your health care provider may ask you questions about your: Medical history, including: Past medical problems. Family medical history. Pregnancy and menstrual history. History of falls. Current health, including: Memory and ability to understand (cognition). Emotional well-being. Home life and relationship well-being. Sexual activity and sexual health. Lifestyle, including: Alcohol, nicotine or tobacco, and drug use. Access to firearms. Diet, exercise, and sleep habits. Work and work Astronomer. Sunscreen use. Safety issues such as seatbelt and bike helmet use. Physical exam Your health care provider will check your: Height and weight. These may be used to calculate your BMI (body mass index). BMI is a measurement that tells if you are at a healthy weight. Waist circumference. This measures the distance around your waistline. This measurement also tells if you are at a healthy weight and may help predict your risk of certain diseases, such as type 2 diabetes and high blood pressure. Heart rate and blood pressure. Body temperature. Skin for abnormal spots. What immunizations do I need?  Vaccines are usually given at various ages, according to a schedule. Your health care provider will recommend vaccines for you based on your age, medical history, and lifestyle or other factors, such as travel or where you work. What tests do I need? Screening Your health care provider may recommend screening tests for certain conditions. This may include: Lipid and cholesterol levels. Hepatitis C test. Hepatitis B test. HIV (human immunodeficiency virus) test. STI (sexually transmitted infection) testing, if you are at  risk. Lung cancer screening. Colorectal cancer screening. Diabetes screening. This is done by checking your blood sugar (glucose) after you have not eaten for a while (fasting). Mammogram. Talk with your health care provider about how often you should have regular mammograms. BRCA-related cancer screening. This may be done if you have a family history of breast, ovarian, tubal, or peritoneal cancers. Bone density scan. This is done to screen for osteoporosis. Talk with your health care provider about your test results, treatment options, and if necessary, the need for more tests. Follow these instructions at home: Eating and drinking  Eat a diet that includes fresh fruits and vegetables, whole grains, lean protein, and low-fat dairy products. Limit your intake of foods with high amounts of sugar, saturated fats, and salt. Take vitamin and mineral supplements as recommended by your health care provider. Do not drink alcohol if your health care provider tells you not to drink. If you drink alcohol: Limit how much you have to 0-1 drink a day. Know how much alcohol is in your drink. In the [place] one drink equals one 12 oz bottle of beer (355 mL), one 5 oz glass of wine (148 mL), or one 1 oz glass of hard liquor (44 mL). Lifestyle Brush your teeth every morning and night with fluoride toothpaste. Floss one time each day. Exercise for at least 30 minutes 5 or more days each week. Do not use any products that contain nicotine or tobacco. These products include cigarettes, chewing tobacco, and vaping devices, such as e-cigarettes. If you need help quitting, ask your health care provider. Do not use drugs. If you are sexually active, practice safe sex. Use a condom or other form of protection in order to prevent STIs. Take aspirin only as told by  your health care provider. Make sure that you understand how much to take and what form to take. Work with your health care provider to find out whether it  is safe and beneficial for you to take aspirin daily. Ask your health care provider if you need to take a cholesterol-lowering medicine (statin). Find healthy ways to manage stress, such as: Meditation, yoga, or listening to music. Journaling. Talking to a trusted person. Spending time with friends and family. Minimize exposure to UV radiation to reduce your risk of skin cancer. Safety Always wear your seat belt while driving or riding in a vehicle. Do not drive: If you have been drinking alcohol. Do not ride with someone who has been drinking. When you are tired or distracted. While texting. If you have been using any mind-altering substances or drugs. Wear a helmet and other protective equipment during sports activities. If you have firearms in your house, make sure you follow all gun safety procedures. What's next? Visit your health care provider once a year for an annual wellness visit. Ask your health care provider how often you should have your eyes and teeth checked. Stay up to date on all vaccines. This information is not intended to replace advice given to you by your health care provider. Make sure you discuss any questions you have with your health care provider. Document Revised: 09/11/2020 Document Reviewed: 09/11/2020 Elsevier Patient Education  2024 [employer].

## 2023-10-22 NOTE — Progress Notes (Signed)
 Complete physical exam  Patient: Hannah Hull   DOB: Sep 08, 1955   68 y.o. Female  MRN: [id number]  Subjective:    Chief Complaint  Patient presents with   Annual Exam    Hebah Bogosian is a 68 y.o. female who presents today for a complete physical exam. She reports consuming a general diet. Walking the dogs for exercise.  She generally feels well. She reports sleeping well. She does not have additional problems to discuss today.    Most recent fall risk assessment:    10/21/2022    8:32 AM  Fall Risk   Falls in the past year? 0  Number falls in past yr: 0  Injury with Fall? 0  Risk for fall due to : No Fall Risks  Follow up Falls evaluation completed     Most recent depression screenings:    07/14/2023   10:17 AM 10/21/2022    8:32 AM  PHQ 2/9 Scores  PHQ - 2 Score 0 0    Vision:Within last year and Dental: No current dental problems and Receives regular dental care    Patient Care Team: Willo Mini, NP as PCP - General (Nurse Practitioner)   Outpatient Medications Prior to Visit  Medication Sig   [DISCONTINUED] alendronate  (FOSAMAX ) 10 MG tablet Take 1 tablet (10 mg total) by mouth daily before breakfast.   [DISCONTINUED] atorvastatin  (LIPITOR) 10 MG tablet Take 1 tablet (10 mg total) by mouth daily.   [DISCONTINUED] EPINEPHrine  0.3 mg/0.3 mL IJ SOAJ injection Inject 0.3 mg into the muscle as needed for anaphylaxis.   [DISCONTINUED] hydrochlorothiazide  (HYDRODIURIL ) 25 MG tablet Take 1 tablet (25 mg total) by mouth daily.   [DISCONTINUED] esomeprazole  (NEXIUM ) 40 MG capsule Take 1 capsule (40 mg total) by mouth every other day.   No facility-administered medications prior to visit.    Review of Systems  Constitutional:  Negative for chills, fever, malaise/fatigue and weight loss.  HENT:  Negative for congestion, ear pain, hearing loss, sinus pain and sore throat.   Eyes:  Negative for blurred vision, photophobia and pain.  Respiratory:  Negative for cough,  shortness of breath and wheezing.   Cardiovascular:  Negative for chest pain, palpitations and leg swelling.  Gastrointestinal:  Negative for abdominal pain, constipation, diarrhea, heartburn, nausea and vomiting.  Genitourinary:  Negative for dysuria, frequency and urgency.  Musculoskeletal:  Positive for joint pain (right knee). Negative for falls and neck pain.  Skin:  Negative for itching and rash.  Neurological:  Negative for dizziness, weakness and headaches.  Endo/Heme/Allergies:  Negative for polydipsia. Does not bruise/bleed easily.  Psychiatric/Behavioral:  Negative for depression, substance abuse and suicidal ideas. The patient is not nervous/anxious.      Objective:     BP 136/78 (BP Location: Left Arm, Cuff Size: Normal)   Pulse (!) 57   Resp 20   Ht 5' 2 (1.575 m)   Wt 188 lb 6.4 oz (85.5 kg)   SpO2 99%   BMI 34.46 kg/m    Physical Exam Vitals reviewed.  Constitutional:      General: She is not in acute distress.    Appearance: Normal appearance. She is obese. She is not ill-appearing.  HENT:     Head: Normocephalic and atraumatic.     Right Ear: Tympanic membrane, ear canal and external ear normal. There is no impacted cerumen.     Left Ear: Tympanic membrane, ear canal and external ear normal. There is no impacted cerumen.     Nose:  Nose normal. No congestion or rhinorrhea.     Mouth/Throat:     Mouth: Mucous membranes are moist.     Pharynx: No oropharyngeal exudate or posterior oropharyngeal erythema.  Eyes:     General: No scleral icterus.       Right eye: No discharge.        Left eye: No discharge.     Extraocular Movements: Extraocular movements intact.     Conjunctiva/sclera: Conjunctivae normal.     Pupils: Pupils are equal, round, and reactive to light.  Neck:     Thyroid: No thyromegaly.     Vascular: No carotid bruit or JVD.     Trachea: Trachea normal.  Cardiovascular:     Rate and Rhythm: Normal rate and regular rhythm.     Pulses: Normal  pulses.     Heart sounds: Normal heart sounds. No murmur heard.    No friction rub. No gallop.  Pulmonary:     Effort: Pulmonary effort is normal. No respiratory distress.     Breath sounds: Normal breath sounds. No wheezing.  Abdominal:     General: Bowel sounds are normal. There is no distension.     Palpations: Abdomen is soft.     Tenderness: There is no abdominal tenderness. There is no guarding.  Musculoskeletal:        General: Normal range of motion.     Cervical back: Normal range of motion and neck supple.  Lymphadenopathy:     Cervical: No cervical adenopathy.  Skin:    General: Skin is warm and dry.  Neurological:     Mental Status: She is alert and oriented to person, place, and time.     Cranial Nerves: No cranial nerve deficit.  Psychiatric:        Mood and Affect: Mood normal.        Behavior: Behavior normal.        Thought Content: Thought content normal.        Judgment: Judgment normal.   No results found for any visits on 10/22/23.     Assessment & Plan:    Routine Health Maintenance and Physical Exam  Immunization History  Administered Date(s) Administered   Fluad Quad(high Dose 65+) 06/03/2021   Influenza,inj,Quad PF,6+ Mos 01/24/2019   Moderna Sars-Covid-2 Vaccination 06/26/2019, 07/23/2019   PNEUMOCOCCAL CONJUGATE-20 10/21/2022   Pneumococcal Conjugate-13 08/15/2021    Health Maintenance  Topic Date Due   DTaP/Tdap/Td (1 - Tdap) Never done   Zoster Vaccines- Shingrix (1 of 2) Never done   DEXA SCAN  09/25/2023   COVID-19 Vaccine (3 - 2024-25 season) 11/07/2023 (Originally 11/29/2022)   INFLUENZA VACCINE  10/29/2023   MAMMOGRAM  03/11/2025   Colonoscopy  06/20/2031   Pneumococcal Vaccine: 50+ Years  Completed   Hepatitis B Vaccines  Aged Out   HPV VACCINES  Aged Out   Meningococcal B Vaccine  Aged Out   Hepatitis C Screening  Discontinued    Discussed health benefits of physical activity, and encouraged her to engage in regular exercise  appropriate for her age and condition.  1. Annual physical exam (Primary) Checking labs as below. UTD on preventative care. Wellness information provided with AVS. - CBC - CMP14+EGFR - Lipid panel  2. Essential hypertension BP well controlled. Continue hydrochlorothiazide . Checking labs. - CBC - CMP14+EGFR - Lipid panel - hydrochlorothiazide  (HYDRODIURIL ) 25 MG tablet; Take 1 tablet (25 mg total) by mouth daily.  Dispense: 90 tablet; Refill: 3  3. Age-related osteoporosis without current  pathological fracture Updating DEXA scan. Continue Fosamax .  - DG Bone Density; Future  4. Hyperlipidemia, unspecified hyperlipidemia type Checking labs. Continue atorvastatin .  - CMP14+EGFR - Lipid panel  5. Anaphylaxis, subsequent encounter Refilling Epipen   Return in about 6 months (around 04/23/2024) for HTN follow up.   Marithza Malachi, NP

## 2023-10-23 ENCOUNTER — Ambulatory Visit: Payer: Self-pay | Admitting: Medical-Surgical

## 2023-10-23 LAB — LIPID PANEL
Chol/HDL Ratio: 2.8 ratio (ref 0.0–4.4)
Cholesterol, Total: 185 mg/dL (ref 100–199)
HDL: 66 mg/dL (ref >39)
LDL Chol Calc (NIH): 99 mg/dL (ref 0–99)
Triglycerides: 114 mg/dL (ref 0–149)
VLDL Cholesterol Cal: 20 mg/dL (ref 5–40)

## 2023-10-23 LAB — CMP14+EGFR
ALT: 26 IU/L (ref 0–32)
AST: 23 IU/L (ref 0–40)
Albumin: 4.1 g/dL (ref 3.9–4.9)
Alkaline Phosphatase: 102 IU/L (ref 44–121)
BUN/Creatinine Ratio: 22 (ref 12–28)
BUN: 17 mg/dL (ref 8–27)
Bilirubin Total: 0.5 mg/dL (ref 0.0–1.2)
CO2: 24 mmol/L (ref 20–29)
Calcium: 9.3 mg/dL (ref 8.7–10.3)
Chloride: 102 mmol/L (ref 96–106)
Creatinine, Ser: 0.78 mg/dL (ref 0.57–1.00)
Globulin, Total: 2.7 g/dL (ref 1.5–4.5)
Glucose: 90 mg/dL (ref 70–99)
Potassium: 3.9 mmol/L (ref 3.5–5.2)
Sodium: 142 mmol/L (ref 134–144)
Total Protein: 6.8 g/dL (ref 6.0–8.5)
eGFR: 83 mL/min/1.73 (ref >59)

## 2023-10-23 LAB — CBC
Hematocrit: 39 % (ref 34.0–46.6)
Hemoglobin: 12.2 g/dL (ref 11.1–15.9)
MCH: 26.5 pg — ABNORMAL LOW (ref 26.6–33.0)
MCHC: 31.3 g/dL — ABNORMAL LOW (ref 31.5–35.7)
MCV: 85 fL (ref 79–97)
Platelets: 299 x10E3/uL (ref 150–450)
RBC: 4.61 x10E6/uL (ref 3.77–5.28)
RDW: 13.2 % (ref 11.7–15.4)
WBC: 7.3 x10E3/uL (ref 3.4–10.8)

## 2023-12-22 ENCOUNTER — Ambulatory Visit (INDEPENDENT_AMBULATORY_CARE_PROVIDER_SITE_OTHER)

## 2023-12-22 DIAGNOSIS — M810 Age-related osteoporosis without current pathological fracture: Secondary | ICD-10-CM

## 2024-02-02 ENCOUNTER — Other Ambulatory Visit: Payer: Self-pay | Admitting: Medical-Surgical

## 2024-02-02 DIAGNOSIS — Z1231 Encounter for screening mammogram for malignant neoplasm of breast: Secondary | ICD-10-CM

## 2024-03-17 ENCOUNTER — Inpatient Hospital Stay: Admission: RE | Admit: 2024-03-17 | Discharge: 2024-03-17 | Attending: Medical-Surgical

## 2024-03-17 DIAGNOSIS — Z1231 Encounter for screening mammogram for malignant neoplasm of breast: Secondary | ICD-10-CM

## 2024-03-24 ENCOUNTER — Ambulatory Visit: Payer: Self-pay | Admitting: Medical-Surgical

## 2024-04-28 ENCOUNTER — Encounter: Payer: Self-pay | Admitting: Medical-Surgical

## 2024-04-28 ENCOUNTER — Ambulatory Visit: Admitting: Medical-Surgical

## 2024-04-28 VITALS — BP 132/88 | HR 68 | Resp 20 | Ht 62.0 in | Wt 190.0 lb

## 2024-04-28 DIAGNOSIS — M810 Age-related osteoporosis without current pathological fracture: Secondary | ICD-10-CM

## 2024-04-28 DIAGNOSIS — Z23 Encounter for immunization: Secondary | ICD-10-CM | POA: Diagnosis not present

## 2024-04-28 DIAGNOSIS — E785 Hyperlipidemia, unspecified: Secondary | ICD-10-CM

## 2024-04-28 DIAGNOSIS — I10 Essential (primary) hypertension: Secondary | ICD-10-CM

## 2024-04-28 NOTE — Progress Notes (Unsigned)
" ° °       Established patient visit   History of Present Illness   Discussed the use of AI scribe software for clinical note transcription with the patient, who gave verbal consent to proceed.  History of Present Illness   Hannah Hull is a 69 year old female with hypertension who presents for a follow-up on her blood pressure management.  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  Constitutional:      General: She is not in acute distress.    Appearance: Normal appearance. She is obese. She is not ill-appearing.  HENT:     Head: Normocephalic and atraumatic.  Cardiovascular:     Rate and Rhythm: Normal rate and regular rhythm.     Pulses: Normal pulses.     Heart sounds: Normal heart sounds. No murmur heard.    No friction rub. No gallop.  Pulmonary:     Effort: Pulmonary effort is normal. No respiratory distress.     Breath sounds: Normal breath sounds. No wheezing.  Skin:    General: Skin is warm and dry.  Neurological:     Mental Status: She is alert and oriented to person, place, and time.  Psychiatric:        Mood and Affect: Mood normal.        Behavior: Behavior normal.        Thought Content: Thought content normal.         Judgment: Judgment normal.    Assessment & Plan   Essential hypertension Blood pressure slightly above goal on arrivat. No symptoms reported. Continues hydrochlorothiazide . - Rechecked blood pressure today- 132/88. - Encouraged home blood pressure monitoring biweekly with a goal of less than 130/80. - Continue hydrochlorothiazide  25mg  daily.  Hyperlipidemia Continues Lipitor for cholesterol management. UTD on labs. - Continue Lipitor 10mg  daily. - Recheck lipids at next 6 month follow up.  Osteoporosis Managed with Fosamax  10mg  daily, well tolerated. Last DEXA scan 11/2023. - Continue Fosamax  10mg  daily.   General health maintenance Due for flu shot.  - Administered flu shot.  Follow up   Return in about 6 months (around 10/26/2024) for chronic disease follow up. __________________________________ Zada FREDRIK Palin, DNP, APRN, FNP-BC Primary Care and Sports Medicine [facility] [place] "

## 2024-10-27 ENCOUNTER — Ambulatory Visit: Admitting: Medical-Surgical
